# Patient Record
Sex: Female | Born: 1935
Health system: Southern US, Community
[De-identification: ages and names within clinical notes are randomized; demographics above are authoritative.]

## PROBLEM LIST (undated history)

## (undated) DIAGNOSIS — F329 Major depressive disorder, single episode, unspecified: Secondary | ICD-10-CM

## (undated) DIAGNOSIS — Z9889 Other specified postprocedural states: Secondary | ICD-10-CM

## (undated) DIAGNOSIS — M1612 Unilateral primary osteoarthritis, left hip: Secondary | ICD-10-CM

## (undated) DIAGNOSIS — Z8679 Personal history of other diseases of the circulatory system: Secondary | ICD-10-CM

## (undated) DIAGNOSIS — R748 Abnormal levels of other serum enzymes: Secondary | ICD-10-CM

## (undated) DIAGNOSIS — M1990 Unspecified osteoarthritis, unspecified site: Secondary | ICD-10-CM

## (undated) DIAGNOSIS — Z96649 Presence of unspecified artificial hip joint: Secondary | ICD-10-CM

## (undated) DIAGNOSIS — I10 Essential (primary) hypertension: Secondary | ICD-10-CM

## (undated) DIAGNOSIS — E785 Hyperlipidemia, unspecified: Secondary | ICD-10-CM

## (undated) DIAGNOSIS — T4145XA Adverse effect of unspecified anesthetic, initial encounter: Secondary | ICD-10-CM

## (undated) DIAGNOSIS — J189 Pneumonia, unspecified organism: Secondary | ICD-10-CM

## (undated) DIAGNOSIS — H269 Unspecified cataract: Secondary | ICD-10-CM

## (undated) DIAGNOSIS — R112 Nausea with vomiting, unspecified: Secondary | ICD-10-CM

## (undated) DIAGNOSIS — E119 Type 2 diabetes mellitus without complications: Secondary | ICD-10-CM

## (undated) DIAGNOSIS — Z953 Presence of xenogenic heart valve: Secondary | ICD-10-CM

## (undated) DIAGNOSIS — F32A Depression, unspecified: Secondary | ICD-10-CM

## (undated) DIAGNOSIS — M109 Gout, unspecified: Secondary | ICD-10-CM

## (undated) DIAGNOSIS — F419 Anxiety disorder, unspecified: Secondary | ICD-10-CM

## (undated) DIAGNOSIS — T8859XA Other complications of anesthesia, initial encounter: Secondary | ICD-10-CM

## (undated) DIAGNOSIS — I491 Atrial premature depolarization: Principal | ICD-10-CM

## (undated) HISTORY — DX: Unilateral primary osteoarthritis, left hip: M16.12

## (undated) HISTORY — DX: Unspecified cataract: H26.9

## (undated) HISTORY — DX: Personal history of other diseases of the circulatory system: Z86.79

## (undated) HISTORY — PX: COLONOSCOPY: SHX174

## (undated) HISTORY — PX: CARDIAC VALVE REPLACEMENT: SHX585

## (undated) HISTORY — DX: Presence of xenogenic heart valve: Z95.3

## (undated) HISTORY — DX: Hyperlipidemia, unspecified: E78.5

## (undated) HISTORY — DX: Anxiety disorder, unspecified: F41.9

## (undated) HISTORY — PX: JOINT REPLACEMENT: SHX530

## (undated) HISTORY — DX: Essential (primary) hypertension: I10

## (undated) HISTORY — DX: Abnormal levels of other serum enzymes: R74.8

## (undated) HISTORY — DX: Atrial premature depolarization: I49.1

## (undated) HISTORY — DX: Presence of unspecified artificial hip joint: Z96.649

## (undated) HISTORY — DX: Type 2 diabetes mellitus without complications: E11.9

## (undated) HISTORY — PX: ABDOMINAL HYSTERECTOMY: SHX81

---

## 2000-05-14 HISTORY — PX: HIP ARTHROPLASTY: SHX981

## 2008-05-14 DIAGNOSIS — J189 Pneumonia, unspecified organism: Secondary | ICD-10-CM

## 2008-05-14 DIAGNOSIS — Z8679 Personal history of other diseases of the circulatory system: Secondary | ICD-10-CM

## 2008-05-14 HISTORY — DX: Pneumonia, unspecified organism: J18.9

## 2008-05-14 HISTORY — PX: AORTIC VALVE REPLACEMENT: SHX41

## 2008-05-14 HISTORY — PX: LEFT HEART CATH AND CORONARY ANGIOGRAPHY: CATH118249

## 2008-05-14 HISTORY — DX: Personal history of other diseases of the circulatory system: Z86.79

## 2008-08-12 ENCOUNTER — Ambulatory Visit: Payer: Self-pay | Admitting: Thoracic Surgery (Cardiothoracic Vascular Surgery)

## 2008-08-24 DIAGNOSIS — Z953 Presence of xenogenic heart valve: Secondary | ICD-10-CM

## 2008-08-24 HISTORY — DX: Presence of xenogenic heart valve: Z95.3

## 2008-09-07 ENCOUNTER — Ambulatory Visit: Payer: Self-pay | Admitting: Thoracic Surgery (Cardiothoracic Vascular Surgery)

## 2008-10-20 ENCOUNTER — Ambulatory Visit: Payer: Self-pay | Admitting: Thoracic Surgery (Cardiothoracic Vascular Surgery)

## 2009-12-21 ENCOUNTER — Ambulatory Visit (HOSPITAL_COMMUNITY): Admission: RE | Admit: 2009-12-21 | Discharge: 2009-12-21 | Payer: Self-pay | Admitting: Family Medicine

## 2010-05-14 HISTORY — PX: KNEE ARTHROPLASTY: SHX992

## 2010-10-03 ENCOUNTER — Encounter (HOSPITAL_COMMUNITY)
Admission: RE | Admit: 2010-10-03 | Discharge: 2010-10-03 | Disposition: A | Discharge status: Home / Self Care | Payer: Medicare Other | Source: Ambulatory Visit | Attending: Orthopaedic Surgery | Admitting: Orthopaedic Surgery

## 2010-10-03 ENCOUNTER — Other Ambulatory Visit (HOSPITAL_COMMUNITY): Payer: Self-pay | Admitting: Orthopaedic Surgery

## 2010-10-03 ENCOUNTER — Ambulatory Visit (HOSPITAL_COMMUNITY)
Admission: RE | Admit: 2010-10-03 | Discharge: 2010-10-03 | Disposition: A | Discharge status: Home / Self Care | Payer: Medicare Other | Source: Ambulatory Visit | Attending: Orthopaedic Surgery | Admitting: Orthopaedic Surgery

## 2010-10-03 DIAGNOSIS — IMO0002 Reserved for concepts with insufficient information to code with codable children: Secondary | ICD-10-CM | POA: Insufficient documentation

## 2010-10-03 DIAGNOSIS — M1712 Unilateral primary osteoarthritis, left knee: Secondary | ICD-10-CM

## 2010-10-03 DIAGNOSIS — Z01812 Encounter for preprocedural laboratory examination: Secondary | ICD-10-CM | POA: Insufficient documentation

## 2010-10-03 DIAGNOSIS — Z01811 Encounter for preprocedural respiratory examination: Secondary | ICD-10-CM | POA: Insufficient documentation

## 2010-10-03 DIAGNOSIS — R0602 Shortness of breath: Secondary | ICD-10-CM | POA: Insufficient documentation

## 2010-10-03 DIAGNOSIS — M1710 Unilateral primary osteoarthritis, unspecified knee: Secondary | ICD-10-CM | POA: Insufficient documentation

## 2010-10-03 LAB — CBC
HCT: 34.4 % — ABNORMAL LOW (ref 36.0–46.0)
MCHC: 31.7 g/dL (ref 30.0–36.0)
MCV: 78.2 fL (ref 78.0–100.0)
RBC: 4.4 MIL/uL (ref 3.87–5.11)

## 2010-10-03 LAB — PROTIME-INR: Prothrombin Time: 13 seconds (ref 11.6–15.2)

## 2010-10-03 LAB — ABO/RH: ABO/RH(D): B POS

## 2010-10-03 LAB — BASIC METABOLIC PANEL
CO2: 23 mEq/L (ref 19–32)
GFR calc Af Amer: 54 mL/min — ABNORMAL LOW (ref >60)
GFR calc non Af Amer: 45 mL/min — ABNORMAL LOW (ref >60)

## 2010-10-03 LAB — TYPE AND SCREEN

## 2010-10-10 ENCOUNTER — Inpatient Hospital Stay (HOSPITAL_COMMUNITY)
Admission: RE | Admit: 2010-10-10 | Discharge: 2010-10-13 | DRG: 470 | Disposition: A | Discharge status: Skilled Nursing Facility | Payer: Medicare Other | Source: Ambulatory Visit | Attending: Orthopaedic Surgery | Admitting: Orthopaedic Surgery

## 2010-10-10 DIAGNOSIS — Z954 Presence of other heart-valve replacement: Secondary | ICD-10-CM

## 2010-10-10 DIAGNOSIS — Z96649 Presence of unspecified artificial hip joint: Secondary | ICD-10-CM

## 2010-10-10 DIAGNOSIS — I10 Essential (primary) hypertension: Secondary | ICD-10-CM | POA: Diagnosis present

## 2010-10-10 DIAGNOSIS — M1710 Unilateral primary osteoarthritis, unspecified knee: Principal | ICD-10-CM | POA: Diagnosis present

## 2010-10-10 DIAGNOSIS — E119 Type 2 diabetes mellitus without complications: Secondary | ICD-10-CM | POA: Diagnosis present

## 2010-10-10 DIAGNOSIS — Z880 Allergy status to penicillin: Secondary | ICD-10-CM

## 2010-10-10 LAB — GLUCOSE, CAPILLARY

## 2010-10-11 LAB — GLUCOSE, CAPILLARY: Glucose-Capillary: 195 mg/dL — ABNORMAL HIGH (ref 70–99)

## 2010-10-11 LAB — CBC
HCT: 26.9 % — ABNORMAL LOW (ref 36.0–46.0)
Hemoglobin: 9 g/dL — ABNORMAL LOW (ref 12.0–15.0)
MCV: 76 fL — ABNORMAL LOW (ref 78.0–100.0)
Platelets: 230 10*3/uL (ref 150–400)
RDW: 13.4 % (ref 11.5–15.5)

## 2010-10-11 LAB — BASIC METABOLIC PANEL
Calcium: 8.8 mg/dL (ref 8.4–10.5)
Chloride: 98 mEq/L (ref 96–112)
GFR calc Af Amer: >60 mL/min (ref >60)
Sodium: 131 mEq/L — ABNORMAL LOW (ref 135–145)

## 2010-10-11 LAB — PROTIME-INR: Prothrombin Time: 14 seconds (ref 11.6–15.2)

## 2010-10-12 LAB — BASIC METABOLIC PANEL
BUN: 11 mg/dL (ref 6–23)
Calcium: 9.2 mg/dL (ref 8.4–10.5)
Chloride: 99 mEq/L (ref 96–112)
GFR calc non Af Amer: >60 mL/min (ref >60)
Glucose, Bld: 153 mg/dL — ABNORMAL HIGH (ref 70–99)

## 2010-10-12 LAB — CBC
HCT: 25.4 % — ABNORMAL LOW (ref 36.0–46.0)
Hemoglobin: 8.4 g/dL — ABNORMAL LOW (ref 12.0–15.0)
MCV: 74.9 fL — ABNORMAL LOW (ref 78.0–100.0)

## 2010-10-12 LAB — PROTIME-INR: INR: 1.54 — ABNORMAL HIGH (ref 0.00–1.49)

## 2010-10-12 LAB — GLUCOSE, CAPILLARY
Glucose-Capillary: 154 mg/dL — ABNORMAL HIGH (ref 70–99)
Glucose-Capillary: 113 mg/dL — ABNORMAL HIGH (ref 70–99)

## 2010-10-13 LAB — GLUCOSE, CAPILLARY: Glucose-Capillary: 134 mg/dL — ABNORMAL HIGH (ref 70–99)

## 2010-10-13 LAB — BASIC METABOLIC PANEL
CO2: 27 mEq/L (ref 19–32)
GFR calc Af Amer: >60 mL/min (ref >60)
Glucose, Bld: 146 mg/dL — ABNORMAL HIGH (ref 70–99)
Potassium: 4.2 mEq/L (ref 3.5–5.1)
Sodium: 131 mEq/L — ABNORMAL LOW (ref 135–145)

## 2010-10-13 LAB — CBC
HCT: 23.3 % — ABNORMAL LOW (ref 36.0–46.0)
Hemoglobin: 7.9 g/dL — ABNORMAL LOW (ref 12.0–15.0)
RBC: 3.1 MIL/uL — ABNORMAL LOW (ref 3.87–5.11)
WBC: 10.7 10*3/uL — ABNORMAL HIGH (ref 4.0–10.5)

## 2010-10-13 LAB — PROTIME-INR: INR: 1.68 — ABNORMAL HIGH (ref 0.00–1.49)

## 2010-10-16 NOTE — Op Note (Signed)
NAMEKARL, KNARR               ACCOUNT NO.:  [id number]  MEDICAL RECORD NO.:  [id number]           PATIENT TYPE:  I  LOCATION:  5037                         FACILITY:  [facility]  PHYSICIAN:  Lubertha Basque. Kalan Yeley, M.D.DATE OF BIRTH:  1935-08-24  DATE OF PROCEDURE:  10/10/2010 DATE OF DISCHARGE:                              OPERATIVE REPORT   PREOPERATIVE DIAGNOSIS:  Left knee degenerative joint disease.  POSTOPERATIVE DIAGNOSIS:  Left knee degenerative joint disease.  PROCEDURE:  Left total knee replacement.  ANESTHESIA:  General and block.  ATTENDING SURGEON:  Lubertha Basque. Jerl Santos, MD  ASSISTANT:  [name], PA   INDICATIONS FOR PROCEDURE:  The patient is a 75 year old woman with long history of left knee pain.  This has persisted despite oral anti- inflammatories and injection and bracing.  By x-ray, she has advanced degenerative change in the medial compartment.  She has pain which limits her ability to walk and rest, and she is offered a knee replacement.  Informed operative consent was obtained after discussion of possible complications including reaction to anesthesia, infection, DVT, PE, and death.  SUMMARY OF FINDINGS AND PROCEDURE:  Under general anesthesia and a knee block, a left knee replacement was performed.  She had advanced degenerative changes in the medial compartment, but excellent bone quality.  We addressed her problem with a cemented DePuy LCS system using standard plus femur, 3 tibia, 10 deep dish spacer, and 38 all polyethylene patella.  I did include tobramycin antibiotic in the cement.  Bryna Colander assisted throughout and was invaluable to the completion of the case in that he helped position and retract while I performed the procedure.  He also closed simultaneously to help minimize OR time.  DESCRIPTION OF PROCEDURE:  The patient was taken to the operating suite where general anesthetic was applied without difficulty.  She was also given a  block in the preanesthesia area.  She was positioned supine and prepped and draped in normal sterile fashion.  After insertion of IV vancomycin, the left leg was elevated, exsanguinated, and tourniquet inflated about the thigh.  A longitudinal anterior incision was made with dissection down to the extensor mechanism.  All appropriate anti- infective measures were used including preoperative IV antibiotic, Betadine impregnated drape, and closed-hooded exhaust systems for each member of the surgical team.  A medial parapatellar incision was made. The kneecap was flipped and the knee flexed.  Some residual meniscal tissues along with the ACL and PCL.  Most of the fat pad was excised. An extramedullary guide was placed in the tibia to make a cut roughly flat.  An intramedullary guide was then placed in the femur to make anterior and posterior cuts creating a flexion gap of 10 mm.  I did re- cut the tibia one time to give [place] good flexion gap at 10 mm.  We then placed a second intramedullary guide in the femur to make a distal cut creating an equal extension gap of 10 mm balancing the knee.  The femur sized to a standard plus and the tibia to a 3 with the appropriate guides were placed and utilized.  A  trial reduction was done and the leg easily came to slight hyperextension and flexed well.  The patella tracked well.  The patella was cut down by about 10 mm to 15 cm in width.  That sized to 38 with appropriate guide placed and utilized. The trial component was removed followed by pulsatile lavage, irrigation of all three cut bony surfaces.  Cement was mixed including tobramycin and was pressurized onto the bones.  The aforementioned components were placed followed by pressure was held on the components until the cement had hardened.  Excess cement was trimmed.  The tourniquet was deflated and a small amount of bleeding was easily controlled with Bovie cautery and some pressure.  The knee was  again irrigated followed by placement of a drain exiting superolaterally.  The extensor mechanism was reapproximated with #1 Vicryl in an interrupted fashion followed by subcutaneous reapproximation with 0 and 2-0 undyed Vicryl and skin closure with staples.  Adaptic was applied followed by dry gauze and loose Ace wrap.  Estimated blood loss, intraoperative fluids, as well as accurate tourniquet time can be obtained from anesthesia records.  DISPOSITION:  The patient was extubated in the operating room and taken to recovery room in stable condition.  She was to be admitted to the Orthopedic Surgery Service for appropriate postop care to include perioperative antibiotics and Coumadin plus Lovenox for DVT prophylaxis.     Lubertha Basque Jerl Santos, M.D.     PGD/MEDQ  D:  10/10/2010  T:  10/10/2010  Job:  [id number]  Electronically Signed by Marcene Corning M.D. on 10/16/2010 01:40:12 PM

## 2010-10-27 ENCOUNTER — Ambulatory Visit (HOSPITAL_COMMUNITY)
Admission: RE | Admit: 2010-10-27 | Discharge: 2010-10-27 | Disposition: A | Discharge status: Home / Self Care | Payer: Medicare Other | Source: Ambulatory Visit | Attending: Internal Medicine | Admitting: Internal Medicine

## 2010-10-27 DIAGNOSIS — Z96659 Presence of unspecified artificial knee joint: Secondary | ICD-10-CM | POA: Insufficient documentation

## 2010-10-27 DIAGNOSIS — M7989 Other specified soft tissue disorders: Secondary | ICD-10-CM

## 2010-11-14 NOTE — H&P (Signed)
Linda Lawson, Linda Lawson               ACCOUNT NO.:  [id number]  MEDICAL RECORD NO.:  [id number]           PATIENT TYPE:  LOCATION:                                 FACILITY:  PHYSICIAN:  Lubertha Basque. Akili Cuda, M.D.DATE OF BIRTH:  16-Feb-1936  DATE OF ADMISSION:10/10/2010 DATE OF DISCHARGE:                             HISTORY & PHYSICAL   Her surgery is tomorrow, Oct 10, 2010.  SOCIAL SECURITY NUMBER:  [id number].  CHIEF COMPLAINT:  Left knee pain.  HISTORY OF PRESENT ILLNESS:  Linda Lawson is a 75 year old female, who is a patient of Dr. Paulita Fujita, referred to our practice for further evaluation of left knee pain.  She on x-ray has end-stage DJD of her knee, was complaining of pain when she ambulates, trouble sleeping at nighttime.  She has also tried Tylenol for pain relief and has had physical therapy which has really been of minimal to no help.  We have discussed with her treatment options that being total knee replacement on the left side.  PERTINENT PAST MEDICAL HISTORY:  She is a diabetic, under oral agent control.  She has allergies to PENICILLIN which causes the itching and swelling.  Her medical coverage is through Dr. Dorothyann Peng.  MEDICATION LIST:  Aspirin 81 mg, hydrochlorothiazide, Zocor, and metformin.  ADDITIONAL 14-POINT REVIEW OF SYSTEMS:  She does wear glasses.  Denies any cardiac issues, pulmonary issues currently but in the past, she had been treated for an aortic valve replacement done in 2010 in [facility]. This is a pig valve which does not require anticoagulation.  She currently does not smoke, does not drink alcohol.  PREVIOUS SURGICAL EXPERIENCE:  Besides her heart valve, a right-sided hip replacement.  PHYSICAL EXAMINATION:  VITAL SIGNS:  Stable.  Blood pressure within normal limits.  Temperature is afebrile. HEENT:  Eyes PERRLA.  Visual fields were normal. NECK:  Cervical motion is full.  No adenopathy noted. LUNGS:  Clear to A and  P. CARDIAC:  Regular rate and rhythm with a mechanical heart valve sound. ABDOMEN:  Positive bowel sounds.  Negative guarding.  No spleen or liver enlargement.  No masses or tenderness noted. EXTREMITIES:  Upper extremities motion is full and pain free.  Lower extremities neurovascular status is intact without any pretibial edema or pitting edema noted.  Good sensation, capillary refill to her toes. Examination of her left knee, she has motion of 0-120.  There is significant crepitation to range of motion.  No significant effusion, but there is severe pain along the medial joint line and patellofemoral area.  ASSESSMENT: 1. Left knee end-stage degenerative joint disease. 2. Status post right hip replacement in 2002 in [place]. 3. Porcine aortic valve replaced in 2010 in [facility] with no current     anticoagulation.  Plan is to do a total knee replacement on this patient and then admit to the hospital postoperatively for pain management control.     [name], P.A.   ______________________________ Lubertha Basque. Jerl Santos, M.D.    MC/MEDQ  D:  10/09/2010  T:  10/09/2010  Job:  [id number]  Electronically Signed by [name] P.A. on  10/24/2010 04:03:13 PM Electronically Signed by Marcene Corning M.D. on 11/14/2010 05:05:54 PM

## 2010-11-14 NOTE — Discharge Summary (Signed)
Linda Lawson, Linda Lawson               ACCOUNT NO.:  [id number]  MEDICAL RECORD NO.:  [id number]           PATIENT TYPE:  LOCATION:                                 FACILITY:  PHYSICIAN:  Lubertha Basque. Analiese Krupka, M.D.DATE OF BIRTH:  08-Aug-1935  DATE OF ADMISSION:  10/10/2010 DATE OF DISCHARGE:  10/13/2010                              DISCHARGE SUMMARY   ADMITTING DIAGNOSES: 1. Left knee end-stage degenerative joint disease. 2. Status post right hip replacement 2002. 3. Porcine aortic valve replaced in 2010 at [facility].  DISCHARGE DIAGNOSES: 1. Left knee end-stage degenerative joint disease. 2. Status post right hip replacement 2002. 3. Porcine aortic valve replaced in 2010 at [facility].  OPERATIONS:  Left total knee replacement.  BRIEF HISTORY:  Ms. Buske is a 75 year old female patient of Dr. Eula Listen in our practice who had referred to [place] because of increasing left knee pain.  She is having pain with every step, trouble sleeping at nighttime, unrelieved by over-the-counter anti-inflammatory medicines or Tylenol.  X-rays reveal end-stage bone-on-bone DJD.  PERTINENT LABORATORY AND X-RAY FINDINGS:  Hemoglobin 8.4, hematocrit 25.4, WBC is 10.8, platelets 202,000.  Sodium 132, potassium 4.2, BUN 11, creatinine 0.91, glucose 153, INR 1.54 last test prior to discharge at the time of dictation.  COURSE IN THE HOSPITAL:  She was admitted postoperatively and placed on a variety of p.o. and IV analgesics for pain, pharmacy for DVT prophylaxis for Coumadin and Lovenox, knee-high TEDs and then we implemented the p.r.n. orthopedic orders and joint replacement protocol orders as well.  She was given appropriate perioperative antibiotics, vancomycin, stool softeners.  Foley catheter was used the first 24 hours, discontinued and able to void on her own.  Physical therapy for weightbearing as tolerated and CPM machine advancing as tolerated, 0-50 degrees and appropriate antiemetics,  antispasmodics and then kept her on home medicines as well.  The first day postop her vital signs were stable.  Hemoglobin 9.1, positive bowel sounds, negative guarding in the abdomen, positive breath sounds, left knee within normal limits.  Drain was in, Hemovac type without sign of infection and she was progressing well with physical therapy.  The second day postop, the drain was pulled, the dressing was changed.  I was working with physical therapy and was able to get up and about in the room and other vital signs were stable, lungs clear, and abdomen was soft and not had a bowel movement second day postop.  Third day postop, she continued to progress well and was cleared by physical therapy to be discharged to a skilled nursing facility which was [facility] which was prearranged prior to admission for extended care postop as she did not have sufficient family at home.  CONDITION ON DISCHARGE:  Improved.  FOLLOWUP:  She could be weightbearing as tolerated with crutches.  May change the dressing daily on her wound.  DIET:  No restrictions.  She continued to have her blood sugars monitored per the physician at the [facility] protocol.  Return to Dr. Jerl Santos office in 2 weeks for any sign of infection which would be increasing redness, pain, drainage,  to call our office [phone] for an appointment, otherwise we will see her in 2 weeks.  She will be on Coumadin for 2 weeks postoperatively with her INRs tested to be in the range of 2-3.0.     [name], P.A.   ______________________________ Lubertha Basque. Jerl Santos, M.D.    MC/MEDQ  D:  10/12/2010  T:  10/13/2010  Job:  [id number]  Electronically Signed by [name] P.A. on 10/24/2010 04:03:37 PM Electronically Signed by Marcene Corning M.D. on 11/14/2010 05:05:57 PM

## 2011-01-23 ENCOUNTER — Other Ambulatory Visit (HOSPITAL_COMMUNITY): Payer: Self-pay | Admitting: Internal Medicine

## 2011-01-23 DIAGNOSIS — Z1231 Encounter for screening mammogram for malignant neoplasm of breast: Secondary | ICD-10-CM

## 2011-02-01 ENCOUNTER — Ambulatory Visit (HOSPITAL_COMMUNITY)
Admission: RE | Admit: 2011-02-01 | Discharge: 2011-02-01 | Disposition: A | Discharge status: Home / Self Care | Payer: Medicare Other | Source: Ambulatory Visit | Attending: Internal Medicine | Admitting: Internal Medicine

## 2011-02-01 DIAGNOSIS — Z1231 Encounter for screening mammogram for malignant neoplasm of breast: Secondary | ICD-10-CM | POA: Insufficient documentation

## 2012-02-12 ENCOUNTER — Other Ambulatory Visit (HOSPITAL_COMMUNITY): Payer: Self-pay | Admitting: Internal Medicine

## 2012-02-12 DIAGNOSIS — Z1231 Encounter for screening mammogram for malignant neoplasm of breast: Secondary | ICD-10-CM

## 2012-02-29 ENCOUNTER — Ambulatory Visit (HOSPITAL_COMMUNITY)
Admission: RE | Admit: 2012-02-29 | Discharge: 2012-02-29 | Disposition: A | Discharge status: Home / Self Care | Payer: Medicare Other | Source: Ambulatory Visit | Attending: Internal Medicine | Admitting: Internal Medicine

## 2012-02-29 DIAGNOSIS — Z1231 Encounter for screening mammogram for malignant neoplasm of breast: Secondary | ICD-10-CM | POA: Insufficient documentation

## 2012-03-05 ENCOUNTER — Other Ambulatory Visit: Payer: Self-pay | Admitting: Internal Medicine

## 2012-03-05 DIAGNOSIS — R928 Other abnormal and inconclusive findings on diagnostic imaging of breast: Secondary | ICD-10-CM

## 2012-03-12 ENCOUNTER — Ambulatory Visit
Admission: RE | Admit: 2012-03-12 | Discharge: 2012-03-12 | Disposition: A | Discharge status: Home / Self Care | Payer: Medicare Other | Source: Ambulatory Visit | Attending: Internal Medicine | Admitting: Internal Medicine

## 2012-03-12 DIAGNOSIS — R928 Other abnormal and inconclusive findings on diagnostic imaging of breast: Secondary | ICD-10-CM

## 2012-06-12 ENCOUNTER — Encounter: Payer: Medicare Other | Attending: Internal Medicine | Admitting: *Deleted

## 2012-06-12 ENCOUNTER — Encounter: Payer: Self-pay | Admitting: *Deleted

## 2012-06-12 DIAGNOSIS — Z713 Dietary counseling and surveillance: Secondary | ICD-10-CM | POA: Insufficient documentation

## 2012-06-12 DIAGNOSIS — E119 Type 2 diabetes mellitus without complications: Secondary | ICD-10-CM | POA: Insufficient documentation

## 2012-06-12 NOTE — Progress Notes (Signed)
  Patient was seen on 06/12/2012 for the first of a series of three diabetes self-management courses at [facility]. Patients last A1C was 7.9% on 05/16/2012. The following learning objectives were met by the patient during this course:   Defines the role of glucose and insulin  Identifies type of diabetes and pathophysiology  Defines the diagnostic criteria for diabetes and prediabetes  States the risk factors for Type 2 Diabetes  States the symptoms of Type 2 Diabetes  Defines Type 2 Diabetes treatment goals  Defines Type 2 Diabetes treatment options  States the rationale for glucose monitoring  Identifies A1C, glucose targets, and testing times  Identifies proper sharps disposal  Defines the purpose of a diabetes food plan  Identifies carbohydrate food groups  Defines effects of carbohydrate foods on glucose levels  Identifies carbohydrate choices/grams/food labels  States benefits of physical activity and effect on glucose  Review of suggested activity guidelines  Handouts given during class include:  Type 2 Diabetes: Basics Book  My Food Plan Book  Food and Activity Log  Follow-Up Plan: Core Class 2

## 2012-06-19 ENCOUNTER — Encounter: Payer: Medicare Other | Attending: Internal Medicine | Admitting: Dietician

## 2012-06-19 DIAGNOSIS — E119 Type 2 diabetes mellitus without complications: Secondary | ICD-10-CM | POA: Insufficient documentation

## 2012-06-19 DIAGNOSIS — E1129 Type 2 diabetes mellitus with other diabetic kidney complication: Secondary | ICD-10-CM

## 2012-06-19 DIAGNOSIS — Z713 Dietary counseling and surveillance: Secondary | ICD-10-CM | POA: Insufficient documentation

## 2012-06-21 NOTE — Progress Notes (Signed)
  Patient was seen on 06/19/2012 for the second of a series of three diabetes self-management courses at the [facility]. The following learning objectives were met by the patient during this course:   Explain basic nutrition maintenance and quality assurance  Describe causes, symptoms and treatment of hypoglycemia and hyperglycemia  Explain how to manage diabetes during illness  Describe the importance of good nutrition for health and healthy eating strategies  List strategies to follow meal plan when dining out  Describe the effects of alcohol on glucose and how to use it safely  Describe problem solving skills for day-to-day glucose challenges  Describe strategies to use when treatment plan needs to change  Identify important factors involved in successful weight loss  Describe ways to remain physically active  Describe the impact of regular activity on insulin resistance   Handouts given in class:  Refrigerator magnet for Sick Day Guidelines  [facility] Oral medication/insulin handout  Nutritional Strategies for Weight Loss with Diabetes  Solving Glucose Puzzles  Follow-Up Plan: Patient will attend the final class of the ADA Diabetes Self-Care Education.

## 2012-07-03 ENCOUNTER — Encounter: Payer: Medicare Other | Admitting: *Deleted

## 2012-07-03 DIAGNOSIS — E119 Type 2 diabetes mellitus without complications: Secondary | ICD-10-CM

## 2012-07-03 NOTE — Patient Instructions (Signed)
Goals:  Follow Diabetes Meal Plan as instructed  Eat 3 meals and 2 snacks, every 3-5 hrs  Limit carbohydrate intake to 45 grams carbohydrate/meal  Limit carbohydrate intake to 15 grams carbohydrate/snack  Add lean protein foods to meals/snacks  Monitor glucose levels as instructed by your doctor  Aim for 30 mins of physical activity daily  Bring food record and glucose log to your next nutrition visit  

## 2012-07-03 NOTE — Progress Notes (Signed)
  Patient was seen on 07/03/12 for the third of a series of three diabetes self-management courses at the [facility]. The following learning objectives were met by the patient during this course:    Describe how diabetes changes over time   Identify diabetes complications and ways to prevent them   Describe strategies that can promote heart health including lowering blood pressure and cholesterol   Describe strategies to lower dietary fat and sodium in the diet   Identify physical activities that benefit cardiovascular health   Evaluate success in meeting personal goal   Describe the belief that they can live successfully with diabetes day to day   Establish 2-3 goals that they will plan to diligently work on until they return for the free 86-month follow-up visit  The following handouts were given in class:  3 Month Follow Up Visit handout  Goal setting handout  Class evaluation form  Your patient has established the following 3 month goals for diabetes self-care:  Count carbohydrates at most meal and snacks  Take diabetes medications as scheduled  To help manage stress, I will walk 2 days/week  Follow-Up Plan: Patient will attend a 3 month follow-up visit for diabetes self-management education.

## 2012-08-28 ENCOUNTER — Other Ambulatory Visit: Payer: Self-pay | Admitting: Internal Medicine

## 2012-08-28 DIAGNOSIS — N63 Unspecified lump in unspecified breast: Secondary | ICD-10-CM

## 2012-09-11 ENCOUNTER — Ambulatory Visit
Admission: RE | Admit: 2012-09-11 | Discharge: 2012-09-11 | Disposition: A | Discharge status: Home / Self Care | Payer: Medicare Other | Source: Ambulatory Visit | Attending: Internal Medicine | Admitting: Internal Medicine

## 2012-09-11 DIAGNOSIS — N63 Unspecified lump in unspecified breast: Secondary | ICD-10-CM

## 2012-10-27 ENCOUNTER — Encounter: Payer: Medicare Other | Attending: Internal Medicine | Admitting: *Deleted

## 2012-10-27 ENCOUNTER — Encounter: Payer: Self-pay | Admitting: *Deleted

## 2012-10-27 DIAGNOSIS — Z713 Dietary counseling and surveillance: Secondary | ICD-10-CM | POA: Insufficient documentation

## 2012-10-27 DIAGNOSIS — E119 Type 2 diabetes mellitus without complications: Secondary | ICD-10-CM | POA: Insufficient documentation

## 2012-10-27 NOTE — Progress Notes (Signed)
  Patient was seen on 10/27/2012 for their 3 month follow-up as a part of the diabetes self-management courses at the [facility]. The following learning objectives were met by your patient during this course:  Patient self reports the following:  Diabetes control has improved since diabetes self-management training: YES Number of days blood glucose is >200: none Last MD appointment for diabetes: April, 2014 Changes in treatment plan: not taking Metformin lately because unsure if should, plans to resume now. Also has started going to gym 3 days a week with great results! Confidence with ability to manage diabetes: YES, she understands the diabetes much better now. Areas for improvement with diabetes self-care: take medications as prescribed Willingness to participate in diabetes support group: perhaps  Please see Diabetes Flow sheet for findings related to patient's self-care.  Follow-Up Plan: Patient is eligible for a "free" 30 minute diabetes self-care appointment in the next year. Patient to call and schedule as needed.

## 2013-01-27 ENCOUNTER — Ambulatory Visit: Payer: Medicare Other | Admitting: *Deleted

## 2013-02-05 ENCOUNTER — Encounter: Payer: Medicare Other | Attending: Internal Medicine | Admitting: *Deleted

## 2013-02-05 ENCOUNTER — Encounter: Payer: Self-pay | Admitting: *Deleted

## 2013-02-05 VITALS — Ht 63.0 in | Wt 193.2 lb

## 2013-02-05 DIAGNOSIS — E119 Type 2 diabetes mellitus without complications: Secondary | ICD-10-CM | POA: Insufficient documentation

## 2013-02-05 DIAGNOSIS — Z713 Dietary counseling and surveillance: Secondary | ICD-10-CM | POA: Insufficient documentation

## 2013-02-05 NOTE — Progress Notes (Signed)
  Patient was seen on 02/05/2013 for their 6 month follow-up as a part of the diabetes self-management courses at the [facility]. The following learning objectives were met by your patient during this course:  Patient self reports the following:  States she has changed her eating habits by stopping any snack after supper, has added walking outside in addition to going to [facility] exercise class and Line Dancing. Reports that her BG's are better too. Down from 190's to 140's on average. She states she is getting her next A1c done in October.  Weight today 193.2 lbs, down 6 pounds since started Class in January.  Diabetes control has improved since diabetes self-management training: YES Number of days blood glucose is >200: none and they are improved from 190's to 140's now Last MD appointment for diabetes: April, 2014 Changes in treatment plan: has resumed Metformin again Also has continued going to gym 3 days a week and added extra walking outside with great results! Confidence with ability to manage diabetes: YES, she understands the diabetes much better now. Areas for improvement with diabetes self-care: doing well now Willingness to participate in diabetes support group: perhaps,   Please see Diabetes Flow sheet for findings related to patient's self-care.  Follow-Up Plan: May call for follow up as needed. She has completed the Core Class Curriculum.

## 2013-03-09 ENCOUNTER — Ambulatory Visit: Payer: Self-pay | Admitting: Podiatry

## 2013-04-08 ENCOUNTER — Encounter: Payer: Self-pay | Admitting: Podiatry

## 2013-04-08 ENCOUNTER — Encounter: Payer: Self-pay | Admitting: *Deleted

## 2013-04-14 ENCOUNTER — Encounter: Payer: Self-pay | Admitting: Podiatry

## 2013-04-14 ENCOUNTER — Ambulatory Visit (INDEPENDENT_AMBULATORY_CARE_PROVIDER_SITE_OTHER): Payer: Medicare Other | Admitting: Podiatry

## 2013-04-14 VITALS — BP 106/54 | HR 68 | Resp 16 | Ht 64.0 in | Wt 185.0 lb

## 2013-04-14 DIAGNOSIS — M79609 Pain in unspecified limb: Secondary | ICD-10-CM

## 2013-04-14 DIAGNOSIS — E1149 Type 2 diabetes mellitus with other diabetic neurological complication: Secondary | ICD-10-CM

## 2013-04-14 DIAGNOSIS — B351 Tinea unguium: Secondary | ICD-10-CM

## 2013-04-14 MED ORDER — PREGABALIN 50 MG PO CAPS: ORAL_CAPSULE | ORAL | Status: DC

## 2013-04-14 NOTE — Progress Notes (Signed)
Linda Lawson presents today with a chief complaint of painful E. elongated toenails and numbness and tingling in her feet it bothers her primarily at nighttime.  Objective: I have reviewed her past medical history medications and allergies. Vital signs are stable she is alert and oriented x3. Pulses are strongly palpable bilateral. Nails are thick yellow dystrophic clinically mycotic. Decrease in sensorium per Semmes-Weinstein monofilament bilateral. Muscle strength is normal.  Assessment: Diabetic peripheral neuropathy with pain in limb secondary to onychomycosis.  Plan: Wrote her prescription for Lyrica she was started on in January. I also debrided nails 1 through 5 bilateral is cover service secondary to pain. I will followup with her one month after she started taking Lyrica. Went ahead and scheduled her in 3 months out for her nail debridement.

## 2013-07-07 ENCOUNTER — Ambulatory Visit: Payer: Medicare Other | Admitting: Podiatry

## 2013-08-06 ENCOUNTER — Ambulatory Visit: Payer: Medicare Other | Admitting: Podiatry

## 2013-08-20 ENCOUNTER — Ambulatory Visit: Payer: Medicare Other | Admitting: Podiatry

## 2013-08-21 ENCOUNTER — Ambulatory Visit (INDEPENDENT_AMBULATORY_CARE_PROVIDER_SITE_OTHER): Payer: Commercial Managed Care - HMO | Admitting: Podiatry

## 2013-08-21 VITALS — BP 152/69 | HR 56 | Resp 12

## 2013-08-21 DIAGNOSIS — E1149 Type 2 diabetes mellitus with other diabetic neurological complication: Secondary | ICD-10-CM

## 2013-08-21 DIAGNOSIS — B351 Tinea unguium: Secondary | ICD-10-CM

## 2013-08-21 DIAGNOSIS — M79609 Pain in unspecified limb: Secondary | ICD-10-CM

## 2013-08-21 NOTE — Progress Notes (Signed)
She presents today with a chief complaint of painful elongated toenails one through 5 bilateral.  Objective: Nails are thick yellow dystrophic onychomycotic and painful palpation. Pulses are palpable bilateral.  Assessment: Pain in limb secondary to onychomycosis 1 through 5 bilateral.  Plan: Debridement of nails in thickness and length as cover service secondary to pain.

## 2013-09-15 ENCOUNTER — Other Ambulatory Visit: Payer: Self-pay | Admitting: Podiatry

## 2013-09-17 ENCOUNTER — Other Ambulatory Visit: Payer: Self-pay | Admitting: Podiatry

## 2013-09-18 ENCOUNTER — Telehealth: Payer: Self-pay | Admitting: *Deleted

## 2013-09-18 NOTE — Telephone Encounter (Signed)
I called and left the patient a message that her prescription for Lyrica is ready to be picked up.  For some reason we are not able to send it to your pharmacy.  Please come by to pick it up.

## 2013-12-22 ENCOUNTER — Ambulatory Visit (INDEPENDENT_AMBULATORY_CARE_PROVIDER_SITE_OTHER): Payer: Commercial Managed Care - HMO | Admitting: Podiatry

## 2013-12-22 ENCOUNTER — Encounter: Payer: Self-pay | Admitting: Podiatry

## 2013-12-22 DIAGNOSIS — M79609 Pain in unspecified limb: Secondary | ICD-10-CM

## 2013-12-22 DIAGNOSIS — B351 Tinea unguium: Secondary | ICD-10-CM

## 2013-12-22 DIAGNOSIS — E1149 Type 2 diabetes mellitus with other diabetic neurological complication: Secondary | ICD-10-CM

## 2013-12-22 DIAGNOSIS — M79676 Pain in unspecified toe(s): Secondary | ICD-10-CM

## 2013-12-22 MED ORDER — PREGABALIN 50 MG PO CAPS: 50.0000 mg | ORAL_CAPSULE | Freq: Two times a day (BID) | ORAL | Status: DC

## 2013-12-22 NOTE — Progress Notes (Signed)
She presents today with a chief complaint of painful elongated toenails one through 5 bilateral.  Objective: Nails are thick yellow dystrophic onychomycotic and painful palpation. Pulses are palpable bilateral.  Assessment: Pain in limb secondary to onychomycosis 1 through 5 bilateral.  Plan: Debridement of nails

## 2014-03-22 ENCOUNTER — Other Ambulatory Visit: Payer: Self-pay | Admitting: Family Medicine

## 2014-03-22 DIAGNOSIS — N63 Unspecified lump in unspecified breast: Secondary | ICD-10-CM

## 2014-03-23 ENCOUNTER — Ambulatory Visit (INDEPENDENT_AMBULATORY_CARE_PROVIDER_SITE_OTHER): Payer: Commercial Managed Care - HMO | Admitting: Podiatry

## 2014-03-23 ENCOUNTER — Encounter: Payer: Self-pay | Admitting: Podiatry

## 2014-03-23 VITALS — BP 112/78 | HR 77 | Resp 17

## 2014-03-23 DIAGNOSIS — M79676 Pain in unspecified toe(s): Secondary | ICD-10-CM

## 2014-03-23 DIAGNOSIS — B351 Tinea unguium: Secondary | ICD-10-CM

## 2014-03-23 NOTE — Progress Notes (Signed)
   Subjective:    Patient ID: Linda DutyEvelyn Guadron, female    DOB: [birth year], 78 y.o.   MRN: [id number]  HPI Pt presents for nail debridement, needs left heel callus trim   Review of Systems     Objective:   Physical Exam: Pulses are palpable bilateral. Nails are thick yellow dystrophic onychomycotic painful palpation.        Assessment & Plan:  Assessment: Pain in limb secondary to onychomycosis 1 through 5 bilateral.  Plan: Debridement of nails 1 through 5 bilateral.

## 2014-04-13 ENCOUNTER — Ambulatory Visit
Admission: RE | Admit: 2014-04-13 | Discharge: 2014-04-13 | Disposition: A | Discharge status: Home / Self Care | Payer: Commercial Managed Care - HMO | Source: Ambulatory Visit | Attending: Family Medicine | Admitting: Family Medicine

## 2014-04-13 DIAGNOSIS — N63 Unspecified lump in unspecified breast: Secondary | ICD-10-CM

## 2014-05-27 DIAGNOSIS — I4891 Unspecified atrial fibrillation: Secondary | ICD-10-CM | POA: Diagnosis not present

## 2014-05-27 DIAGNOSIS — Z952 Presence of prosthetic heart valve: Secondary | ICD-10-CM | POA: Diagnosis not present

## 2014-06-22 ENCOUNTER — Ambulatory Visit: Payer: Commercial Managed Care - HMO | Admitting: Podiatry

## 2014-06-30 DIAGNOSIS — R6889 Other general symptoms and signs: Secondary | ICD-10-CM | POA: Diagnosis not present

## 2014-06-30 DIAGNOSIS — N189 Chronic kidney disease, unspecified: Secondary | ICD-10-CM | POA: Diagnosis not present

## 2014-06-30 DIAGNOSIS — I10 Essential (primary) hypertension: Secondary | ICD-10-CM | POA: Diagnosis not present

## 2014-07-01 DIAGNOSIS — R6889 Other general symptoms and signs: Secondary | ICD-10-CM | POA: Diagnosis not present

## 2014-07-01 DIAGNOSIS — Z23 Encounter for immunization: Secondary | ICD-10-CM | POA: Diagnosis not present

## 2014-07-01 DIAGNOSIS — E119 Type 2 diabetes mellitus without complications: Secondary | ICD-10-CM | POA: Diagnosis not present

## 2014-07-01 DIAGNOSIS — N189 Chronic kidney disease, unspecified: Secondary | ICD-10-CM | POA: Diagnosis not present

## 2014-07-01 DIAGNOSIS — E782 Mixed hyperlipidemia: Secondary | ICD-10-CM | POA: Diagnosis not present

## 2014-07-01 DIAGNOSIS — I10 Essential (primary) hypertension: Secondary | ICD-10-CM | POA: Diagnosis not present

## 2014-08-05 ENCOUNTER — Ambulatory Visit: Payer: Commercial Managed Care - HMO

## 2014-08-19 ENCOUNTER — Ambulatory Visit (INDEPENDENT_AMBULATORY_CARE_PROVIDER_SITE_OTHER): Payer: Commercial Managed Care - HMO | Admitting: Podiatry

## 2014-08-19 ENCOUNTER — Encounter: Payer: Self-pay | Admitting: Podiatry

## 2014-08-19 DIAGNOSIS — J309 Allergic rhinitis, unspecified: Secondary | ICD-10-CM | POA: Diagnosis not present

## 2014-08-19 DIAGNOSIS — B351 Tinea unguium: Secondary | ICD-10-CM

## 2014-08-19 DIAGNOSIS — E782 Mixed hyperlipidemia: Secondary | ICD-10-CM | POA: Diagnosis not present

## 2014-08-19 DIAGNOSIS — R6889 Other general symptoms and signs: Secondary | ICD-10-CM | POA: Diagnosis not present

## 2014-08-19 DIAGNOSIS — I10 Essential (primary) hypertension: Secondary | ICD-10-CM | POA: Diagnosis not present

## 2014-08-19 DIAGNOSIS — M79676 Pain in unspecified toe(s): Secondary | ICD-10-CM

## 2014-08-19 DIAGNOSIS — E119 Type 2 diabetes mellitus without complications: Secondary | ICD-10-CM | POA: Diagnosis not present

## 2014-08-19 NOTE — Progress Notes (Signed)
Presents today with chief complaint of painful elongated toenails.  Objective: Vital signs are stable alert and oriented 3. Pulses are palpable bilateral. Nails are thick yellow dystrophic onychomycotic and painful palpation.  Assessment: Pain in limb secondary to onychomycosis 1 through 5 bilateral.  Plan: Discussed etiology pathology conservative versus surgical therapies. Debrided nails 1 through 5 bilateral as a covered service. Follow up with her in 3 months

## 2014-11-09 DIAGNOSIS — N183 Chronic kidney disease, stage 3 (moderate): Secondary | ICD-10-CM | POA: Diagnosis not present

## 2014-11-09 DIAGNOSIS — D631 Anemia in chronic kidney disease: Secondary | ICD-10-CM | POA: Diagnosis not present

## 2014-11-09 DIAGNOSIS — I129 Hypertensive chronic kidney disease with stage 1 through stage 4 chronic kidney disease, or unspecified chronic kidney disease: Secondary | ICD-10-CM | POA: Diagnosis not present

## 2014-11-09 DIAGNOSIS — E1129 Type 2 diabetes mellitus with other diabetic kidney complication: Secondary | ICD-10-CM | POA: Diagnosis not present

## 2014-11-10 DIAGNOSIS — M179 Osteoarthritis of knee, unspecified: Secondary | ICD-10-CM | POA: Diagnosis not present

## 2014-11-17 DIAGNOSIS — N189 Chronic kidney disease, unspecified: Secondary | ICD-10-CM | POA: Diagnosis not present

## 2014-11-17 DIAGNOSIS — E119 Type 2 diabetes mellitus without complications: Secondary | ICD-10-CM | POA: Diagnosis not present

## 2014-11-17 DIAGNOSIS — I10 Essential (primary) hypertension: Secondary | ICD-10-CM | POA: Diagnosis not present

## 2014-11-17 DIAGNOSIS — E782 Mixed hyperlipidemia: Secondary | ICD-10-CM | POA: Diagnosis not present

## 2014-12-02 ENCOUNTER — Encounter: Payer: Self-pay | Admitting: Podiatry

## 2014-12-02 ENCOUNTER — Ambulatory Visit (INDEPENDENT_AMBULATORY_CARE_PROVIDER_SITE_OTHER): Payer: Commercial Managed Care - HMO | Admitting: Podiatry

## 2014-12-02 DIAGNOSIS — B351 Tinea unguium: Secondary | ICD-10-CM | POA: Diagnosis not present

## 2014-12-02 DIAGNOSIS — M79676 Pain in unspecified toe(s): Secondary | ICD-10-CM | POA: Diagnosis not present

## 2014-12-02 NOTE — Progress Notes (Signed)
Patient ID: Linda Lawson, female   DOB: March 30, 1936, 79 y.o.   MRN: [id number] Complaint:  Visit Type: Patient returns to my office for continued preventative foot care services. Complaint: Patient states" my nails have grown long and thick and become painful to walk and wear shoes" . The patient presents for preventative foot care services. No changes to ROS  Podiatric Exam: Vascular: dorsalis pedis and posterior tibial pulses are palpable bilateral. Capillary return is immediate. Temperature gradient is WNL. Skin turgor WNL  Sensorium: Normal Semmes Weinstein monofilament test. Normal tactile sensation bilaterally. Nail Exam: Pt has thick disfigured discolored nails with subungual debris noted bilateral entire nail hallux through fifth toenails Ulcer Exam: There is no evidence of ulcer or pre-ulcerative changes or infection. Orthopedic Exam: Muscle tone and strength are WNL. No limitations in general ROM. No crepitus or effusions noted. Foot type and digits show no abnormalities. Bony prominences are unremarkable. Skin: No Porokeratosis. No infection or ulcers  Diagnosis:  Onychomycosis, , Pain in right toe, pain in left toes  Treatment & Plan Procedures and Treatment: Consent by patient was obtained for treatment procedures. The patient understood the discussion of treatment and procedures well. All questions were answered thoroughly reviewed. Debridement of mycotic and hypertrophic toenails, 1 through 5 bilateral and clearing of subungual debris. No ulceration, no infection noted.  Return Visit-Office Procedure: Patient instructed to return to the office for a follow up visit 3 months for continued evaluation and treatment.

## 2014-12-14 DIAGNOSIS — E875 Hyperkalemia: Secondary | ICD-10-CM | POA: Diagnosis not present

## 2014-12-14 DIAGNOSIS — R7989 Other specified abnormal findings of blood chemistry: Secondary | ICD-10-CM | POA: Diagnosis not present

## 2014-12-14 DIAGNOSIS — N19 Unspecified kidney failure: Secondary | ICD-10-CM | POA: Diagnosis not present

## 2014-12-14 DIAGNOSIS — E119 Type 2 diabetes mellitus without complications: Secondary | ICD-10-CM | POA: Diagnosis not present

## 2014-12-14 DIAGNOSIS — M7542 Impingement syndrome of left shoulder: Secondary | ICD-10-CM | POA: Diagnosis not present

## 2014-12-17 DIAGNOSIS — N19 Unspecified kidney failure: Secondary | ICD-10-CM | POA: Diagnosis not present

## 2014-12-17 DIAGNOSIS — N189 Chronic kidney disease, unspecified: Secondary | ICD-10-CM | POA: Diagnosis not present

## 2014-12-17 DIAGNOSIS — E875 Hyperkalemia: Secondary | ICD-10-CM | POA: Diagnosis not present

## 2014-12-20 DIAGNOSIS — M7542 Impingement syndrome of left shoulder: Secondary | ICD-10-CM | POA: Diagnosis not present

## 2014-12-20 DIAGNOSIS — J309 Allergic rhinitis, unspecified: Secondary | ICD-10-CM | POA: Diagnosis not present

## 2014-12-20 DIAGNOSIS — N189 Chronic kidney disease, unspecified: Secondary | ICD-10-CM | POA: Diagnosis not present

## 2014-12-20 DIAGNOSIS — I10 Essential (primary) hypertension: Secondary | ICD-10-CM | POA: Diagnosis not present

## 2015-01-04 DIAGNOSIS — E875 Hyperkalemia: Secondary | ICD-10-CM | POA: Diagnosis not present

## 2015-01-04 DIAGNOSIS — N19 Unspecified kidney failure: Secondary | ICD-10-CM | POA: Diagnosis not present

## 2015-01-05 DIAGNOSIS — I10 Essential (primary) hypertension: Secondary | ICD-10-CM | POA: Diagnosis not present

## 2015-01-05 DIAGNOSIS — E782 Mixed hyperlipidemia: Secondary | ICD-10-CM | POA: Diagnosis not present

## 2015-01-05 DIAGNOSIS — Z23 Encounter for immunization: Secondary | ICD-10-CM | POA: Diagnosis not present

## 2015-01-05 DIAGNOSIS — E119 Type 2 diabetes mellitus without complications: Secondary | ICD-10-CM | POA: Diagnosis not present

## 2015-01-24 ENCOUNTER — Telehealth: Payer: Self-pay | Admitting: *Deleted

## 2015-01-26 NOTE — Telephone Encounter (Signed)
[name] - Dr. Maryelizabeth Rowan office request office notes, faxed to [phone], phone [phone].

## 2015-01-26 NOTE — Telephone Encounter (Signed)
Please take care of

## 2015-01-27 NOTE — Telephone Encounter (Signed)
Printed all ov notes from 12.04.2014 - present and faxed to Dr. Chauncy Passy office. -Completed.

## 2015-03-15 ENCOUNTER — Ambulatory Visit (INDEPENDENT_AMBULATORY_CARE_PROVIDER_SITE_OTHER): Payer: Commercial Managed Care - HMO | Admitting: Podiatry

## 2015-03-15 ENCOUNTER — Encounter: Payer: Self-pay | Admitting: Podiatry

## 2015-03-15 DIAGNOSIS — R6889 Other general symptoms and signs: Secondary | ICD-10-CM | POA: Diagnosis not present

## 2015-03-15 DIAGNOSIS — M79676 Pain in unspecified toe(s): Secondary | ICD-10-CM | POA: Diagnosis not present

## 2015-03-15 DIAGNOSIS — B351 Tinea unguium: Secondary | ICD-10-CM

## 2015-03-15 NOTE — Progress Notes (Signed)
Patient ID: Linda Lawson, female   DOB: 1936/03/04, 79 y.o.   MRN: [id number] Complaint:  Visit Type: Patient returns to my office for continued preventative foot care services. Complaint: Patient states" my nails have grown long and thick and become painful to walk and wear shoes" .Patient is a diabetic with no foot complications. The patient presents for preventative foot care services. No changes to ROS  Podiatric Exam: Vascular: dorsalis pedis and posterior tibial pulses are palpable bilateral. Capillary return is immediate. Temperature gradient is WNL. Skin turgor WNL  Sensorium: Normal Semmes Weinstein monofilament test. Normal tactile sensation bilaterally. Nail Exam: Pt has thick disfigured discolored nails with subungual debris noted bilateral entire nail hallux through fifth toenails Ulcer Exam: There is no evidence of ulcer or pre-ulcerative changes or infection. Orthopedic Exam: Muscle tone and strength are WNL. No limitations in general ROM. No crepitus or effusions noted. Foot type and digits show no abnormalities. Bony prominences are unremarkable. Skin: No Porokeratosis. No infection or ulcers  Diagnosis:  Onychomycosis, , Pain in right toe, pain in left toes  Treatment & Plan Procedures and Treatment: Consent by patient was obtained for treatment procedures. The patient understood the discussion of treatment and procedures well. All questions were answered thoroughly reviewed. Debridement of mycotic and hypertrophic toenails, 1 through 5 bilateral and clearing of subungual debris. No ulceration, no infection noted.  Return Visit-Office Procedure: Patient instructed to return to the office for a follow up visit 3 months for continued evaluation and treatment.

## 2015-04-06 DIAGNOSIS — N19 Unspecified kidney failure: Secondary | ICD-10-CM | POA: Diagnosis not present

## 2015-04-06 DIAGNOSIS — I10 Essential (primary) hypertension: Secondary | ICD-10-CM | POA: Diagnosis not present

## 2015-04-06 DIAGNOSIS — E119 Type 2 diabetes mellitus without complications: Secondary | ICD-10-CM | POA: Diagnosis not present

## 2015-04-06 DIAGNOSIS — E782 Mixed hyperlipidemia: Secondary | ICD-10-CM | POA: Diagnosis not present

## 2015-06-21 ENCOUNTER — Ambulatory Visit: Payer: Commercial Managed Care - HMO | Admitting: Podiatry

## 2015-06-21 DIAGNOSIS — R739 Hyperglycemia, unspecified: Secondary | ICD-10-CM | POA: Diagnosis not present

## 2015-06-21 DIAGNOSIS — I10 Essential (primary) hypertension: Secondary | ICD-10-CM | POA: Diagnosis not present

## 2015-06-21 DIAGNOSIS — E782 Mixed hyperlipidemia: Secondary | ICD-10-CM | POA: Diagnosis not present

## 2015-06-24 ENCOUNTER — Encounter: Payer: Self-pay | Admitting: Podiatry

## 2015-06-24 ENCOUNTER — Ambulatory Visit (INDEPENDENT_AMBULATORY_CARE_PROVIDER_SITE_OTHER): Payer: Commercial Managed Care - HMO | Admitting: Podiatry

## 2015-06-24 DIAGNOSIS — Q828 Other specified congenital malformations of skin: Secondary | ICD-10-CM

## 2015-06-24 DIAGNOSIS — M79676 Pain in unspecified toe(s): Secondary | ICD-10-CM | POA: Diagnosis not present

## 2015-06-24 DIAGNOSIS — B351 Tinea unguium: Secondary | ICD-10-CM

## 2015-06-24 NOTE — Progress Notes (Signed)
Patient ID: Linda Lawson, female   DOB: 03-19-1936, 80 y.o.   MRN: [id number] Complaint:  Visit Type: Patient returns to my office for continued preventative foot care services. Complaint: Patient states" my nails have grown long and thick and become painful to walk and wear shoes" .Patient is a diabetic with no foot complications. The patient presents for preventative foot care services. No changes to ROS  Podiatric Exam: Vascular: dorsalis pedis and posterior tibial pulses are palpable bilateral. Capillary return is immediate. Temperature gradient is WNL. Skin turgor WNL  Sensorium: Normal Semmes Weinstein monofilament test. Normal tactile sensation bilaterally. Nail Exam: Pt has thick disfigured discolored nails with subungual debris noted bilateral entire nail hallux through fifth toenails Ulcer Exam: There is no evidence of ulcer or pre-ulcerative changes or infection. Orthopedic Exam: Muscle tone and strength are WNL. No limitations in general ROM. No crepitus or effusions noted. Foot type and digits show no abnormalities. Bony prominences are unremarkable. Skin:  Porokeratosis left heel.. No infection or ulcers  Diagnosis:  Onychomycosis, , Pain in right toe, pain in left toes, Porokeratosis left foot  Treatment & Plan Procedures and Treatment: Consent by patient was obtained for treatment procedures. The patient understood the discussion of treatment and procedures well. All questions were answered thoroughly reviewed. Debridement of mycotic and hypertrophic toenails, 1 through 5 bilateral and clearing of subungual debris. No ulceration, no infection noted.  Return Visit-Office Procedure: Patient instructed to return to the office for a follow up visit 3 months for continued evaluation and treatment.   Helane Gunther DPM

## 2015-09-21 DIAGNOSIS — I10 Essential (primary) hypertension: Secondary | ICD-10-CM | POA: Diagnosis not present

## 2015-09-21 DIAGNOSIS — N19 Unspecified kidney failure: Secondary | ICD-10-CM | POA: Diagnosis not present

## 2015-09-21 DIAGNOSIS — E119 Type 2 diabetes mellitus without complications: Secondary | ICD-10-CM | POA: Diagnosis not present

## 2015-09-21 DIAGNOSIS — M25559 Pain in unspecified hip: Secondary | ICD-10-CM | POA: Diagnosis not present

## 2015-09-21 DIAGNOSIS — M7072 Other bursitis of hip, left hip: Secondary | ICD-10-CM | POA: Diagnosis not present

## 2015-09-21 DIAGNOSIS — E782 Mixed hyperlipidemia: Secondary | ICD-10-CM | POA: Diagnosis not present

## 2015-09-27 DIAGNOSIS — E119 Type 2 diabetes mellitus without complications: Secondary | ICD-10-CM | POA: Diagnosis not present

## 2015-09-28 DIAGNOSIS — E782 Mixed hyperlipidemia: Secondary | ICD-10-CM | POA: Diagnosis not present

## 2015-09-28 DIAGNOSIS — I10 Essential (primary) hypertension: Secondary | ICD-10-CM | POA: Diagnosis not present

## 2015-09-28 DIAGNOSIS — E119 Type 2 diabetes mellitus without complications: Secondary | ICD-10-CM | POA: Diagnosis not present

## 2015-09-28 DIAGNOSIS — N189 Chronic kidney disease, unspecified: Secondary | ICD-10-CM | POA: Diagnosis not present

## 2015-10-04 ENCOUNTER — Encounter: Payer: Self-pay | Admitting: Podiatry

## 2015-10-04 ENCOUNTER — Ambulatory Visit (INDEPENDENT_AMBULATORY_CARE_PROVIDER_SITE_OTHER): Payer: Commercial Managed Care - HMO | Admitting: Podiatry

## 2015-10-04 DIAGNOSIS — Q828 Other specified congenital malformations of skin: Secondary | ICD-10-CM

## 2015-10-04 DIAGNOSIS — B351 Tinea unguium: Secondary | ICD-10-CM

## 2015-10-04 DIAGNOSIS — E119 Type 2 diabetes mellitus without complications: Secondary | ICD-10-CM

## 2015-10-04 DIAGNOSIS — M79676 Pain in unspecified toe(s): Secondary | ICD-10-CM

## 2015-10-04 DIAGNOSIS — M2010 Hallux valgus (acquired), unspecified foot: Secondary | ICD-10-CM

## 2015-10-04 NOTE — Progress Notes (Signed)
Patient ID: Linda Lawson, female   DOB: [birth year], 80 y.o.   MRN: [id number] Complaint:  Visit Type: Patient returns to my office for continued preventative foot care services. Complaint: Patient states" my nails have grown long and thick and become painful to walk and wear shoes" .Patient is a diabetic with no foot complications. The patient presents for preventative foot care services. No changes to ROS  Podiatric Exam: Vascular: dorsalis pedis and posterior tibial pulses are palpable bilateral. Capillary return is immediate. Temperature gradient is WNL. Skin turgor WNL  Sensorium: Absent  Semmes Weinstein monofilament test. Normal tactile sensation bilaterally. Nail Exam: Pt has thick disfigured discolored nails with subungual debris noted bilateral entire nail hallux through fifth toenails Ulcer Exam: There is no evidence of ulcer or pre-ulcerative changes or infection. Orthopedic Exam: Muscle tone and strength are WNL. No limitations in general ROM. No crepitus or effusions noted. Foot type and digits show no abnormalities. HAV 1st MPJ  B/L Skin:  Porokeratosis left heel.. No infection or ulcers  Diagnosis:  Onychomycosis, , Pain in right toe, pain in left toes, Porokeratosis left foot  Treatment & Plan Procedures and Treatment: Consent by patient was obtained for treatment procedures. The patient understood the discussion of treatment and procedures well. All questions were answered thoroughly reviewed. Debridement of mycotic and hypertrophic toenails, 1 through 5 bilateral and clearing of subungual debris. No ulceration, no infection noted. Initiate diabetic shoes due to neuropathy and HAV  B/L Return Visit-Office Procedure: Patient instructed to return to the office for a follow up visit 3 months for continued evaluation and treatment.   Helane GuntherGregory Vonya Ohalloran DPM

## 2015-10-12 DIAGNOSIS — E119 Type 2 diabetes mellitus without complications: Secondary | ICD-10-CM | POA: Diagnosis not present

## 2015-10-24 ENCOUNTER — Other Ambulatory Visit: Payer: Self-pay | Admitting: Family Medicine

## 2015-10-24 ENCOUNTER — Ambulatory Visit
Admission: RE | Admit: 2015-10-24 | Discharge: 2015-10-24 | Disposition: A | Discharge status: Home / Self Care | Payer: Commercial Managed Care - HMO | Source: Ambulatory Visit | Attending: Family Medicine | Admitting: Family Medicine

## 2015-10-24 DIAGNOSIS — M1612 Unilateral primary osteoarthritis, left hip: Secondary | ICD-10-CM

## 2015-10-25 DIAGNOSIS — E119 Type 2 diabetes mellitus without complications: Secondary | ICD-10-CM | POA: Diagnosis not present

## 2015-10-25 DIAGNOSIS — M179 Osteoarthritis of knee, unspecified: Secondary | ICD-10-CM | POA: Diagnosis not present

## 2015-10-25 DIAGNOSIS — M7072 Other bursitis of hip, left hip: Secondary | ICD-10-CM | POA: Diagnosis not present

## 2015-10-25 DIAGNOSIS — M109 Gout, unspecified: Secondary | ICD-10-CM | POA: Diagnosis not present

## 2015-11-01 DIAGNOSIS — M25552 Pain in left hip: Secondary | ICD-10-CM | POA: Diagnosis not present

## 2015-11-01 DIAGNOSIS — R262 Difficulty in walking, not elsewhere classified: Secondary | ICD-10-CM | POA: Diagnosis not present

## 2015-11-01 DIAGNOSIS — M6281 Muscle weakness (generalized): Secondary | ICD-10-CM | POA: Diagnosis not present

## 2015-11-01 DIAGNOSIS — M25652 Stiffness of left hip, not elsewhere classified: Secondary | ICD-10-CM | POA: Diagnosis not present

## 2015-11-10 DIAGNOSIS — M25652 Stiffness of left hip, not elsewhere classified: Secondary | ICD-10-CM | POA: Diagnosis not present

## 2015-11-10 DIAGNOSIS — R262 Difficulty in walking, not elsewhere classified: Secondary | ICD-10-CM | POA: Diagnosis not present

## 2015-11-10 DIAGNOSIS — M6281 Muscle weakness (generalized): Secondary | ICD-10-CM | POA: Diagnosis not present

## 2015-11-10 DIAGNOSIS — M25552 Pain in left hip: Secondary | ICD-10-CM | POA: Diagnosis not present

## 2015-11-17 DIAGNOSIS — M25552 Pain in left hip: Secondary | ICD-10-CM | POA: Diagnosis not present

## 2015-11-17 DIAGNOSIS — R262 Difficulty in walking, not elsewhere classified: Secondary | ICD-10-CM | POA: Diagnosis not present

## 2015-11-17 DIAGNOSIS — M25652 Stiffness of left hip, not elsewhere classified: Secondary | ICD-10-CM | POA: Diagnosis not present

## 2015-11-17 DIAGNOSIS — M6281 Muscle weakness (generalized): Secondary | ICD-10-CM | POA: Diagnosis not present

## 2015-11-18 DIAGNOSIS — R262 Difficulty in walking, not elsewhere classified: Secondary | ICD-10-CM | POA: Diagnosis not present

## 2015-11-18 DIAGNOSIS — M25552 Pain in left hip: Secondary | ICD-10-CM | POA: Diagnosis not present

## 2015-11-18 DIAGNOSIS — M6281 Muscle weakness (generalized): Secondary | ICD-10-CM | POA: Diagnosis not present

## 2015-11-18 DIAGNOSIS — M25652 Stiffness of left hip, not elsewhere classified: Secondary | ICD-10-CM | POA: Diagnosis not present

## 2015-11-23 DIAGNOSIS — M25652 Stiffness of left hip, not elsewhere classified: Secondary | ICD-10-CM | POA: Diagnosis not present

## 2015-11-23 DIAGNOSIS — M25552 Pain in left hip: Secondary | ICD-10-CM | POA: Diagnosis not present

## 2015-11-23 DIAGNOSIS — M6281 Muscle weakness (generalized): Secondary | ICD-10-CM | POA: Diagnosis not present

## 2015-11-23 DIAGNOSIS — R262 Difficulty in walking, not elsewhere classified: Secondary | ICD-10-CM | POA: Diagnosis not present

## 2015-11-24 DIAGNOSIS — R262 Difficulty in walking, not elsewhere classified: Secondary | ICD-10-CM | POA: Diagnosis not present

## 2015-11-24 DIAGNOSIS — M6281 Muscle weakness (generalized): Secondary | ICD-10-CM | POA: Diagnosis not present

## 2015-11-24 DIAGNOSIS — M25652 Stiffness of left hip, not elsewhere classified: Secondary | ICD-10-CM | POA: Diagnosis not present

## 2015-11-24 DIAGNOSIS — M25552 Pain in left hip: Secondary | ICD-10-CM | POA: Diagnosis not present

## 2015-11-30 DIAGNOSIS — M25552 Pain in left hip: Secondary | ICD-10-CM | POA: Diagnosis not present

## 2015-11-30 DIAGNOSIS — M25652 Stiffness of left hip, not elsewhere classified: Secondary | ICD-10-CM | POA: Diagnosis not present

## 2015-11-30 DIAGNOSIS — M6281 Muscle weakness (generalized): Secondary | ICD-10-CM | POA: Diagnosis not present

## 2015-11-30 DIAGNOSIS — R262 Difficulty in walking, not elsewhere classified: Secondary | ICD-10-CM | POA: Diagnosis not present

## 2015-12-02 DIAGNOSIS — M6281 Muscle weakness (generalized): Secondary | ICD-10-CM | POA: Diagnosis not present

## 2015-12-02 DIAGNOSIS — M25652 Stiffness of left hip, not elsewhere classified: Secondary | ICD-10-CM | POA: Diagnosis not present

## 2015-12-02 DIAGNOSIS — R262 Difficulty in walking, not elsewhere classified: Secondary | ICD-10-CM | POA: Diagnosis not present

## 2015-12-02 DIAGNOSIS — M25552 Pain in left hip: Secondary | ICD-10-CM | POA: Diagnosis not present

## 2015-12-06 DIAGNOSIS — M6281 Muscle weakness (generalized): Secondary | ICD-10-CM | POA: Diagnosis not present

## 2015-12-06 DIAGNOSIS — M25552 Pain in left hip: Secondary | ICD-10-CM | POA: Diagnosis not present

## 2015-12-06 DIAGNOSIS — M25652 Stiffness of left hip, not elsewhere classified: Secondary | ICD-10-CM | POA: Diagnosis not present

## 2015-12-06 DIAGNOSIS — R262 Difficulty in walking, not elsewhere classified: Secondary | ICD-10-CM | POA: Diagnosis not present

## 2015-12-08 DIAGNOSIS — M25552 Pain in left hip: Secondary | ICD-10-CM | POA: Diagnosis not present

## 2015-12-08 DIAGNOSIS — M6281 Muscle weakness (generalized): Secondary | ICD-10-CM | POA: Diagnosis not present

## 2015-12-08 DIAGNOSIS — M25652 Stiffness of left hip, not elsewhere classified: Secondary | ICD-10-CM | POA: Diagnosis not present

## 2015-12-08 DIAGNOSIS — R262 Difficulty in walking, not elsewhere classified: Secondary | ICD-10-CM | POA: Diagnosis not present

## 2015-12-13 DIAGNOSIS — M25552 Pain in left hip: Secondary | ICD-10-CM | POA: Diagnosis not present

## 2015-12-13 DIAGNOSIS — M6281 Muscle weakness (generalized): Secondary | ICD-10-CM | POA: Diagnosis not present

## 2015-12-13 DIAGNOSIS — R262 Difficulty in walking, not elsewhere classified: Secondary | ICD-10-CM | POA: Diagnosis not present

## 2015-12-13 DIAGNOSIS — M25652 Stiffness of left hip, not elsewhere classified: Secondary | ICD-10-CM | POA: Diagnosis not present

## 2015-12-15 DIAGNOSIS — R262 Difficulty in walking, not elsewhere classified: Secondary | ICD-10-CM | POA: Diagnosis not present

## 2015-12-15 DIAGNOSIS — M25652 Stiffness of left hip, not elsewhere classified: Secondary | ICD-10-CM | POA: Diagnosis not present

## 2015-12-15 DIAGNOSIS — M6281 Muscle weakness (generalized): Secondary | ICD-10-CM | POA: Diagnosis not present

## 2015-12-15 DIAGNOSIS — M25552 Pain in left hip: Secondary | ICD-10-CM | POA: Diagnosis not present

## 2015-12-19 DIAGNOSIS — E119 Type 2 diabetes mellitus without complications: Secondary | ICD-10-CM | POA: Diagnosis not present

## 2015-12-20 DIAGNOSIS — M25652 Stiffness of left hip, not elsewhere classified: Secondary | ICD-10-CM | POA: Diagnosis not present

## 2015-12-20 DIAGNOSIS — R262 Difficulty in walking, not elsewhere classified: Secondary | ICD-10-CM | POA: Diagnosis not present

## 2015-12-20 DIAGNOSIS — M6281 Muscle weakness (generalized): Secondary | ICD-10-CM | POA: Diagnosis not present

## 2015-12-20 DIAGNOSIS — M25552 Pain in left hip: Secondary | ICD-10-CM | POA: Diagnosis not present

## 2015-12-21 DIAGNOSIS — I10 Essential (primary) hypertension: Secondary | ICD-10-CM | POA: Diagnosis not present

## 2015-12-21 DIAGNOSIS — M7072 Other bursitis of hip, left hip: Secondary | ICD-10-CM | POA: Diagnosis not present

## 2015-12-21 DIAGNOSIS — E119 Type 2 diabetes mellitus without complications: Secondary | ICD-10-CM | POA: Diagnosis not present

## 2015-12-21 DIAGNOSIS — E782 Mixed hyperlipidemia: Secondary | ICD-10-CM | POA: Diagnosis not present

## 2015-12-22 DIAGNOSIS — R262 Difficulty in walking, not elsewhere classified: Secondary | ICD-10-CM | POA: Diagnosis not present

## 2015-12-22 DIAGNOSIS — M6281 Muscle weakness (generalized): Secondary | ICD-10-CM | POA: Diagnosis not present

## 2015-12-22 DIAGNOSIS — M25652 Stiffness of left hip, not elsewhere classified: Secondary | ICD-10-CM | POA: Diagnosis not present

## 2015-12-22 DIAGNOSIS — M25552 Pain in left hip: Secondary | ICD-10-CM | POA: Diagnosis not present

## 2016-01-03 ENCOUNTER — Ambulatory Visit: Payer: Commercial Managed Care - HMO | Admitting: Podiatry

## 2016-01-10 DIAGNOSIS — M109 Gout, unspecified: Secondary | ICD-10-CM | POA: Diagnosis not present

## 2016-01-10 DIAGNOSIS — N183 Chronic kidney disease, stage 3 (moderate): Secondary | ICD-10-CM | POA: Diagnosis not present

## 2016-01-10 DIAGNOSIS — E1129 Type 2 diabetes mellitus with other diabetic kidney complication: Secondary | ICD-10-CM | POA: Diagnosis not present

## 2016-01-10 DIAGNOSIS — I129 Hypertensive chronic kidney disease with stage 1 through stage 4 chronic kidney disease, or unspecified chronic kidney disease: Secondary | ICD-10-CM | POA: Diagnosis not present

## 2016-01-10 DIAGNOSIS — Z6833 Body mass index (BMI) 33.0-33.9, adult: Secondary | ICD-10-CM | POA: Diagnosis not present

## 2016-01-10 DIAGNOSIS — E785 Hyperlipidemia, unspecified: Secondary | ICD-10-CM | POA: Diagnosis not present

## 2016-01-10 DIAGNOSIS — D631 Anemia in chronic kidney disease: Secondary | ICD-10-CM | POA: Diagnosis not present

## 2016-01-17 ENCOUNTER — Encounter: Payer: Self-pay | Admitting: Podiatry

## 2016-01-17 ENCOUNTER — Ambulatory Visit (INDEPENDENT_AMBULATORY_CARE_PROVIDER_SITE_OTHER): Payer: Commercial Managed Care - HMO | Admitting: Podiatry

## 2016-01-17 DIAGNOSIS — M79676 Pain in unspecified toe(s): Secondary | ICD-10-CM

## 2016-01-17 DIAGNOSIS — Q828 Other specified congenital malformations of skin: Secondary | ICD-10-CM | POA: Diagnosis not present

## 2016-01-17 DIAGNOSIS — B351 Tinea unguium: Secondary | ICD-10-CM | POA: Diagnosis not present

## 2016-01-17 NOTE — Progress Notes (Signed)
Patient ID: Linda Lawson, female   DOB: 1935-07-10, 80 y.o.   MRN: [id number] Complaint:  Visit Type: Patient returns to my office for continued preventative foot care services. Complaint: Patient states" my nails have grown long and thick and become painful to walk and wear shoes" .Patient is a diabetic with no foot complications. The patient presents for preventative foot care services. No changes to ROS  Podiatric Exam: Vascular: dorsalis pedis and posterior tibial pulses are palpable bilateral. Capillary return is immediate. Temperature gradient is WNL. Skin turgor WNL  Sensorium: Absent  Semmes Weinstein monofilament test. Normal tactile sensation bilaterally. Nail Exam: Pt has thick disfigured discolored nails with subungual debris noted bilateral entire nail hallux through fifth toenails Ulcer Exam: There is no evidence of ulcer or pre-ulcerative changes or infection. Orthopedic Exam: Muscle tone and strength are WNL. No limitations in general ROM. No crepitus or effusions noted. Foot type and digits show no abnormalities. HAV 1st MPJ  B/L Skin:  Porokeratosis left heel.. No infection or ulcers  Diagnosis:  Onychomycosis, , Pain in right toe, pain in left toes, Porokeratosis left foot  Treatment & Plan Procedures and Treatment: Consent by patient was obtained for treatment procedures. The patient understood the discussion of treatment and procedures well. All questions were answered thoroughly reviewed. Debridement of mycotic and hypertrophic toenails, 1 through 5 bilateral and clearing of subungual debris. No ulceration, no infection noted. Initiate diabetic shoes due to neuropathy and HAV  B/L Return Visit-Office Procedure: Patient instructed to return to the office for a follow up visit 3 months for continued evaluation and treatment.   Helane GuntherGregory Starlyn Droge DPM

## 2016-02-13 ENCOUNTER — Ambulatory Visit (INDEPENDENT_AMBULATORY_CARE_PROVIDER_SITE_OTHER): Payer: Commercial Managed Care - HMO | Admitting: Orthopaedic Surgery

## 2016-02-13 DIAGNOSIS — M1612 Unilateral primary osteoarthritis, left hip: Secondary | ICD-10-CM | POA: Diagnosis not present

## 2016-02-24 ENCOUNTER — Ambulatory Visit: Payer: Commercial Managed Care - HMO | Admitting: *Deleted

## 2016-02-24 DIAGNOSIS — E119 Type 2 diabetes mellitus without complications: Secondary | ICD-10-CM

## 2016-02-24 NOTE — Progress Notes (Signed)
Patient ID: Linda Lawson, female   DOB: 11-26-1935, 80 y.o.   MRN: [id number]  Patient presents to be scanned and measured for diabetic shoes and inserts.

## 2016-03-01 DIAGNOSIS — Z952 Presence of prosthetic heart valve: Secondary | ICD-10-CM | POA: Diagnosis not present

## 2016-03-01 DIAGNOSIS — I38 Endocarditis, valve unspecified: Secondary | ICD-10-CM | POA: Insufficient documentation

## 2016-03-01 DIAGNOSIS — I491 Atrial premature depolarization: Secondary | ICD-10-CM | POA: Diagnosis not present

## 2016-03-01 DIAGNOSIS — Z01810 Encounter for preprocedural cardiovascular examination: Secondary | ICD-10-CM | POA: Diagnosis not present

## 2016-03-01 DIAGNOSIS — R6889 Other general symptoms and signs: Secondary | ICD-10-CM | POA: Diagnosis not present

## 2016-03-13 DIAGNOSIS — R6889 Other general symptoms and signs: Secondary | ICD-10-CM | POA: Diagnosis not present

## 2016-03-13 DIAGNOSIS — Z01810 Encounter for preprocedural cardiovascular examination: Secondary | ICD-10-CM | POA: Diagnosis not present

## 2016-03-13 DIAGNOSIS — Z952 Presence of prosthetic heart valve: Secondary | ICD-10-CM | POA: Diagnosis not present

## 2016-03-16 ENCOUNTER — Telehealth (INDEPENDENT_AMBULATORY_CARE_PROVIDER_SITE_OTHER): Payer: Self-pay | Admitting: Orthopaedic Surgery

## 2016-03-16 NOTE — Telephone Encounter (Signed)
Left voice mail message for patient to call me back to schedule surgery. 

## 2016-03-23 ENCOUNTER — Telehealth (INDEPENDENT_AMBULATORY_CARE_PROVIDER_SITE_OTHER): Payer: Self-pay | Admitting: *Deleted

## 2016-03-23 ENCOUNTER — Ambulatory Visit (INDEPENDENT_AMBULATORY_CARE_PROVIDER_SITE_OTHER): Payer: Commercial Managed Care - HMO | Admitting: Podiatry

## 2016-03-23 ENCOUNTER — Encounter: Payer: Self-pay | Admitting: Podiatry

## 2016-03-23 DIAGNOSIS — Q828 Other specified congenital malformations of skin: Secondary | ICD-10-CM | POA: Diagnosis not present

## 2016-03-23 DIAGNOSIS — M2010 Hallux valgus (acquired), unspecified foot: Secondary | ICD-10-CM

## 2016-03-23 DIAGNOSIS — E119 Type 2 diabetes mellitus without complications: Secondary | ICD-10-CM

## 2016-03-23 NOTE — Telephone Encounter (Signed)
Misty StanleyLisa calling requesting office notes from 10/2 sent to Dr. Orland Deceweys office. Fax [phone]

## 2016-03-23 NOTE — Progress Notes (Signed)
Patient ID: Linda Lawson, female   DOB: 1935-06-16, 80 y.o.   MRN: [id number] Complaint:  Visit Type: Patient returns to my office to pick up her jnew diabetic shoes.  Podiatric Exam: Vascular: dorsalis pedis and posterior tibial pulses are palpable bilateral. Capillary return is immediate. Temperature gradient is WNL. Skin turgor WNL  Sensorium: Absent  Semmes Weinstein monofilament test. Normal tactile sensation bilaterally. Nail Exam: Pt has thick disfigured discolored nails with subungual debris noted bilateral entire nail hallux through fifth toenails Ulcer Exam: There is no evidence of ulcer or pre-ulcerative changes or infection. Orthopedic Exam: Muscle tone and strength are WNL. No limitations in general ROM. No crepitus or effusions noted. Foot type and digits show no abnormalities. HAV 1st MPJ  B/L Skin:  Porokeratosis left heel.. No infection or ulcers  Diagnosis:  Diabetes with neuropathy  HAV   B/L  Treatment & Plan Procedures and Treatment: Diabetic shoes were dispensed.  Patient presents today and was dispensed 0ne pair ( two units) of medically necessary extra depth shoes with three pair( six units) of custom molded multiple density inserts. The shoes and the inserts are fitted to the patients ' feet and are noted to fit well and are free of defect.  Length and width of the shoes are also acceptable.  Patient was given written and verbal  instructions for wearing.  If any concerns arrive with the shoes or inserts, the patient is to call the office.Patient is to follow up with doctor in six weeks. Return Visit-Office Procedure: Patient instructed to return to the office for a follow up  for continued nail care.   Linda Lawson DPM

## 2016-03-27 DIAGNOSIS — M179 Osteoarthritis of knee, unspecified: Secondary | ICD-10-CM | POA: Diagnosis not present

## 2016-03-27 DIAGNOSIS — E119 Type 2 diabetes mellitus without complications: Secondary | ICD-10-CM | POA: Diagnosis not present

## 2016-03-27 DIAGNOSIS — I10 Essential (primary) hypertension: Secondary | ICD-10-CM | POA: Diagnosis not present

## 2016-03-27 DIAGNOSIS — N39490 Overflow incontinence: Secondary | ICD-10-CM | POA: Diagnosis not present

## 2016-03-27 NOTE — Telephone Encounter (Signed)
Faxed to [phone]

## 2016-03-28 NOTE — Telephone Encounter (Signed)
Patient called me back and left voice mail to return her call.  I called her today and left voice mail for her to return my call to schedule surgery.

## 2016-04-04 ENCOUNTER — Other Ambulatory Visit (INDEPENDENT_AMBULATORY_CARE_PROVIDER_SITE_OTHER): Payer: Self-pay | Admitting: Orthopaedic Surgery

## 2016-04-04 DIAGNOSIS — M1612 Unilateral primary osteoarthritis, left hip: Secondary | ICD-10-CM

## 2016-04-17 ENCOUNTER — Encounter (HOSPITAL_COMMUNITY): Payer: Self-pay

## 2016-04-17 ENCOUNTER — Encounter (HOSPITAL_COMMUNITY)
Admission: RE | Admit: 2016-04-17 | Discharge: 2016-04-17 | Disposition: A | Discharge status: Home / Self Care | Payer: Commercial Managed Care - HMO | Source: Ambulatory Visit | Attending: Orthopaedic Surgery | Admitting: Orthopaedic Surgery

## 2016-04-17 DIAGNOSIS — Z952 Presence of prosthetic heart valve: Secondary | ICD-10-CM

## 2016-04-17 DIAGNOSIS — E119 Type 2 diabetes mellitus without complications: Secondary | ICD-10-CM

## 2016-04-17 DIAGNOSIS — Z791 Long term (current) use of non-steroidal anti-inflammatories (NSAID): Secondary | ICD-10-CM | POA: Diagnosis not present

## 2016-04-17 DIAGNOSIS — Z0183 Encounter for blood typing: Secondary | ICD-10-CM | POA: Insufficient documentation

## 2016-04-17 DIAGNOSIS — Z885 Allergy status to narcotic agent status: Secondary | ICD-10-CM | POA: Diagnosis not present

## 2016-04-17 DIAGNOSIS — F339 Major depressive disorder, recurrent, unspecified: Secondary | ICD-10-CM | POA: Diagnosis not present

## 2016-04-17 DIAGNOSIS — Z7982 Long term (current) use of aspirin: Secondary | ICD-10-CM

## 2016-04-17 DIAGNOSIS — Z471 Aftercare following joint replacement surgery: Secondary | ICD-10-CM | POA: Diagnosis not present

## 2016-04-17 DIAGNOSIS — R2689 Other abnormalities of gait and mobility: Secondary | ICD-10-CM | POA: Diagnosis not present

## 2016-04-17 DIAGNOSIS — M1990 Unspecified osteoarthritis, unspecified site: Secondary | ICD-10-CM | POA: Diagnosis not present

## 2016-04-17 DIAGNOSIS — Z87891 Personal history of nicotine dependence: Secondary | ICD-10-CM

## 2016-04-17 DIAGNOSIS — I10 Essential (primary) hypertension: Secondary | ICD-10-CM

## 2016-04-17 DIAGNOSIS — M6281 Muscle weakness (generalized): Secondary | ICD-10-CM | POA: Diagnosis not present

## 2016-04-17 DIAGNOSIS — Z01818 Encounter for other preprocedural examination: Secondary | ICD-10-CM | POA: Insufficient documentation

## 2016-04-17 DIAGNOSIS — Z01812 Encounter for preprocedural laboratory examination: Secondary | ICD-10-CM

## 2016-04-17 DIAGNOSIS — Z96641 Presence of right artificial hip joint: Secondary | ICD-10-CM | POA: Diagnosis present

## 2016-04-17 DIAGNOSIS — Z79899 Other long term (current) drug therapy: Secondary | ICD-10-CM

## 2016-04-17 DIAGNOSIS — R011 Cardiac murmur, unspecified: Secondary | ICD-10-CM | POA: Diagnosis present

## 2016-04-17 DIAGNOSIS — D62 Acute posthemorrhagic anemia: Secondary | ICD-10-CM | POA: Diagnosis not present

## 2016-04-17 DIAGNOSIS — F329 Major depressive disorder, single episode, unspecified: Secondary | ICD-10-CM | POA: Diagnosis not present

## 2016-04-17 DIAGNOSIS — E785 Hyperlipidemia, unspecified: Secondary | ICD-10-CM

## 2016-04-17 DIAGNOSIS — Z96652 Presence of left artificial knee joint: Secondary | ICD-10-CM | POA: Diagnosis present

## 2016-04-17 DIAGNOSIS — Z880 Allergy status to penicillin: Secondary | ICD-10-CM | POA: Diagnosis not present

## 2016-04-17 DIAGNOSIS — M1612 Unilateral primary osteoarthritis, left hip: Secondary | ICD-10-CM

## 2016-04-17 DIAGNOSIS — Z794 Long term (current) use of insulin: Secondary | ICD-10-CM | POA: Diagnosis not present

## 2016-04-17 DIAGNOSIS — Z96642 Presence of left artificial hip joint: Secondary | ICD-10-CM | POA: Diagnosis not present

## 2016-04-17 DIAGNOSIS — M109 Gout, unspecified: Secondary | ICD-10-CM | POA: Diagnosis present

## 2016-04-17 DIAGNOSIS — M25559 Pain in unspecified hip: Secondary | ICD-10-CM | POA: Diagnosis not present

## 2016-04-17 HISTORY — DX: Unspecified osteoarthritis, unspecified site: M19.90

## 2016-04-17 HISTORY — DX: Other complications of anesthesia, initial encounter: T88.59XA

## 2016-04-17 HISTORY — DX: Adverse effect of unspecified anesthetic, initial encounter: T41.45XA

## 2016-04-17 HISTORY — DX: Nausea with vomiting, unspecified: R11.2

## 2016-04-17 HISTORY — DX: Other specified postprocedural states: Z98.890

## 2016-04-17 HISTORY — DX: Gout, unspecified: M10.9

## 2016-04-17 HISTORY — DX: Pneumonia, unspecified organism: J18.9

## 2016-04-17 HISTORY — DX: Depression, unspecified: F32.A

## 2016-04-17 HISTORY — DX: Major depressive disorder, single episode, unspecified: F32.9

## 2016-04-17 LAB — URINALYSIS, ROUTINE W REFLEX MICROSCOPIC
BILIRUBIN URINE: NEGATIVE
GLUCOSE, UA: NEGATIVE mg/dL
HGB URINE DIPSTICK: NEGATIVE
KETONES UR: NEGATIVE mg/dL
Leukocytes, UA: NEGATIVE
Nitrite: NEGATIVE
PROTEIN: NEGATIVE mg/dL
Specific Gravity, Urine: 1.008 (ref 1.005–1.030)
pH: 5 (ref 5.0–8.0)

## 2016-04-17 LAB — CBC WITH DIFFERENTIAL/PLATELET
BASOS PCT: 0 %
Basophils Absolute: 0 10*3/uL (ref 0.0–0.1)
EOS ABS: 0.2 10*3/uL (ref 0.0–0.7)
EOS PCT: 2 %
HCT: 33 % — ABNORMAL LOW (ref 36.0–46.0)
Hemoglobin: 10.4 g/dL — ABNORMAL LOW (ref 12.0–15.0)
LYMPHS ABS: 2.5 10*3/uL (ref 0.7–4.0)
Lymphocytes Relative: 29 %
MCH: 23.9 pg — AB (ref 26.0–34.0)
MCHC: 31.5 g/dL (ref 30.0–36.0)
MCV: 75.7 fL — ABNORMAL LOW (ref 78.0–100.0)
MONOS PCT: 5 %
Monocytes Absolute: 0.5 10*3/uL (ref 0.1–1.0)
Neutro Abs: 5.5 10*3/uL (ref 1.7–7.7)
Neutrophils Relative %: 64 %
PLATELETS: 276 10*3/uL (ref 150–400)
RBC: 4.36 MIL/uL (ref 3.87–5.11)
RDW: 13.6 % (ref 11.5–15.5)
WBC: 8.6 10*3/uL (ref 4.0–10.5)

## 2016-04-17 LAB — TYPE AND SCREEN
ABO/RH(D): B POS
ANTIBODY SCREEN: NEGATIVE

## 2016-04-17 LAB — GLUCOSE, CAPILLARY: GLUCOSE-CAPILLARY: 172 mg/dL — AB (ref 65–99)

## 2016-04-17 LAB — COMPREHENSIVE METABOLIC PANEL
ALBUMIN: 3.7 g/dL (ref 3.5–5.0)
ALT: 18 U/L (ref 14–54)
ANION GAP: 5 (ref 5–15)
AST: 22 U/L (ref 15–41)
Alkaline Phosphatase: 76 U/L (ref 38–126)
BUN: 14 mg/dL (ref 6–20)
CHLORIDE: 108 mmol/L (ref 101–111)
CO2: 25 mmol/L (ref 22–32)
Calcium: 9.3 mg/dL (ref 8.9–10.3)
Creatinine, Ser: 0.97 mg/dL (ref 0.44–1.00)
GFR calc non Af Amer: 55 mL/min — ABNORMAL LOW (ref >60)
Glucose, Bld: 129 mg/dL — ABNORMAL HIGH (ref 65–99)
Potassium: 4.3 mmol/L (ref 3.5–5.1)
SODIUM: 138 mmol/L (ref 135–145)
Total Bilirubin: 0.4 mg/dL (ref 0.3–1.2)
Total Protein: 6.6 g/dL (ref 6.5–8.1)

## 2016-04-17 LAB — PROTIME-INR
INR: 1.02
Prothrombin Time: 13.4 seconds (ref 11.4–15.2)

## 2016-04-17 LAB — SURGICAL PCR SCREEN
MRSA, PCR: NEGATIVE
STAPHYLOCOCCUS AUREUS: NEGATIVE

## 2016-04-17 LAB — SEDIMENTATION RATE: SED RATE: 42 mm/h — AB (ref 0–22)

## 2016-04-17 LAB — C-REACTIVE PROTEIN: CRP: 3.5 mg/dL — ABNORMAL HIGH (ref <1.0)

## 2016-04-17 LAB — APTT: aPTT: 25 seconds (ref 24–36)

## 2016-04-17 NOTE — Progress Notes (Addendum)
   How to Manage Your Diabetes Before and After Surgery  Why is it important to control my blood sugar before and after surgery? . Improving blood sugar levels before and after surgery helps healing and can limit problems. . A way of improving blood sugar control is eating a healthy diet by: o  Eating less sugar and carbohydrates o  Increasing activity/exercise o  Talking with your doctor about reaching your blood sugar goals . High blood sugars (greater than 180 mg/dL) can raise your risk of infections and slow your recovery, so you will need to focus on controlling your diabetes during the weeks before surgery. . Make sure that the doctor who takes care of your diabetes knows about your planned surgery including the date and location.  How do I manage my blood sugar before surgery? . Check your blood sugar at least 4 times a day, starting 2 days before surgery, to make sure that the level is not too high or low. o Check your blood sugar the morning of your surgery when you wake up and every 2 hours until you get to the Short Stay unit. . If your blood sugar is less than 70 mg/dL, you will need to treat for low blood sugar: o Do not take insulin. o Treat a low blood sugar (less than 70 mg/dL) with  cup of clear juice (cranberry or apple), 4 glucose tablets, OR glucose gel. o Recheck blood sugar in 15 minutes after treatment (to make sure it is greater than 70 mg/dL). If your blood sugar is not greater than 70 mg/dL on recheck, call [phone] for further instructions. . Report your blood sugar to the short stay nurse when you get to Short Stay.  . If you are admitted to the hospital after surgery: o Your blood sugar will be checked by the staff and you will probably be given insulin after surgery (instead of oral diabetes medicines) to make sure you have good blood sugar levels. o The goal for blood sugar control after surgery is 80-180 mg/dL.       WHAT DO I DO ABOUT MY DIABETES  MEDICATION  . Do not take oral diabetes medicines (pills) the morning of surgery.       . THE MORNING OF SURGERY, take  5 units of  Lantus_insulin.

## 2016-04-17 NOTE — Pre-Procedure Instructions (Signed)
    Wallis Bambergvelyn D Menn  04/17/2016     Your procedure is scheduled on Thursday, December 7.  [facility]15 [facility]                Your surgery or procedure is scheduled for 12:15 PM   Call this number if you have problems the morning of surgery:[phone]                   Remember:  Do not eat food or drink liquids after midnight Wednesday, December 6.  Take these medicines the morning of surgery with A SIP OF WATER: allopurinol (ZYLOPRIM).                DO NOT take Tradjenta the morning of surgery.              See information regarding Lantus Insulin on separate sheet.   Do not wear jewelry, make-up or nail polish.  Do not wear lotions, powders, or perfumes, or deodorant.  Do not shave 48 hours prior to surgery.    Do not bring valuables to the hospital.  [facility] any belongings or valuables.  Contacts, dentures or bridgework may not be worn into surgery.  Leave your suitcase in the car.  After surgery it may be brought to your room.  For patients admitted to the hospital, discharge time will be determined by your treatment team.  Special instructions: Review  [place] - Preparing For Surgery.  Please read over the following fact sheets that you were given. [place]- Preparing For Surgery and Patient Instructions for Mupirocin Application, Pain Booklet

## 2016-04-18 ENCOUNTER — Encounter (HOSPITAL_COMMUNITY): Payer: Self-pay

## 2016-04-18 LAB — HEMOGLOBIN A1C
HEMOGLOBIN A1C: 7.9 % — AB (ref 4.8–5.6)
MEAN PLASMA GLUCOSE: 180 mg/dL

## 2016-04-18 MED ORDER — TRANEXAMIC ACID 1000 MG/10ML IV SOLN
1000.0000 mg | INTRAVENOUS | Status: AC
  Administered 2016-04-19: 1000 mg via INTRAVENOUS
  Filled 2016-04-18 (×2): qty 10

## 2016-04-18 NOTE — Progress Notes (Signed)
Anesthesia Chart Review: Patient is a 80 year old female scheduled for left THA, anterior approach on 04/19/16 by Dr. Roda ShuttersXu.  History includes post-operative N/V, former smoker, s/p AVR (pericardial tissue) '10, HLD, HTN, DOE, DM2, depression, gout, arthritis, left TKA '12.  Meds include allopurinol, aspirin 81 mg, Lantus, lisinopril, Claritin, pravastatin, Tradjenta.  PCP is Dr. Maryelizabeth RowanElizabeth Dewey. Cardiologist is Dr. Heide ScalesHerman Cheek (see Care Everywhere). He saw patient on 03/01/16 and felt she would be "Low overall cardiovascular risk."  BP (!) 152/64   Pulse 61   Temp 36.8 C (Oral)   Resp 20   Ht 5\' 4"  (1.626 m)   Wt 187 lb 6.3 oz (85 kg)   SpO2 100%   BMI 32.17 kg/m   03/01/16 EKG (Dr Beverely Paceheek): Normal sinus rhythm, frequent PACs, LVH with secondary T-wave changes (inferior leads and anterolateral leads V3-6).  03/13/16 Echo (Dr. Beverely Paceheek): Conclusion: 1. Left ventricle cavity is normal in size. 2. Normal global wall motion. 3. Calculated EF 65%. 4. Mild concentric LVH. 5. AVR appears well-seated, gradient 40 mm [36.0 mmHg] peak, 20 mm [18.0 mmHg] mean. AV peak velocity 3.01 m/s, AV VTI 65.7 cm , AVA (Mvax) 1.05 cm2, AVA (VTI) 1.25 cm2. No aortic regurgitation. 6. Ascending aorta not well seen-appears dilated. 7. RA is mildly dilated. 8. Mild mitral regurgitation. 9. Mild tricuspid regurgitation-RVSP mildly elevated.  06/28/10 Stress Echo ([facility]; scanned under Media tab, Correspondence, 10/10/10): Summary: Fair exercise tolerance 4 METS--poor cardiovascular conditioning suggested. Normal resting LV systolic function with mild LV hypertrophy. Normal blood pressure response to exercise. Normal stress echo and nondiagnostic EKG, no evidence of ischemia.  07/13/08 PRE-AVR Cardiac cath ([facility]; scanned under Media tab, Correspondence, 10/10/10): Conclusions: 1. Minimal CAD with normal LV function. Right heart cath 2. RHC: RA 6/7, RV 29/5, PA 27/14, wedge 7, CO by flick 3.65 (Cl 2), mean AV gradient 33  mmHg, AVA 0.84. 3. Moderately Severe AS. (Subsequently underwent AVR.)  Preoperative labs noted. H/H 10.4/33.0. Cr 0.97. PT/PTT WNL. A1c 7.9. T&S done. UA negative leukocytes, nitrites.  If no acute changes then I anticipate that she can proceed as planned.  Velna Ochsllison Tabytha Gradillas, PA-C [facility]Anesthesiology Phone [phone] 04/18/2016 11:41 AM

## 2016-04-19 ENCOUNTER — Inpatient Hospital Stay (HOSPITAL_COMMUNITY): Payer: Commercial Managed Care - HMO | Admitting: Certified Registered Nurse Anesthetist

## 2016-04-19 ENCOUNTER — Inpatient Hospital Stay (HOSPITAL_COMMUNITY): Payer: Commercial Managed Care - HMO

## 2016-04-19 ENCOUNTER — Inpatient Hospital Stay (HOSPITAL_COMMUNITY)
Admission: RE | Admit: 2016-04-19 | Discharge: 2016-04-21 | DRG: 470 | Disposition: A | Discharge status: Skilled Nursing Facility | Payer: Commercial Managed Care - HMO | Source: Ambulatory Visit | Attending: Orthopaedic Surgery | Admitting: Orthopaedic Surgery

## 2016-04-19 ENCOUNTER — Encounter (HOSPITAL_COMMUNITY): Payer: Self-pay | Admitting: Surgery

## 2016-04-19 ENCOUNTER — Encounter (HOSPITAL_COMMUNITY)
Admission: RE | Disposition: A | Discharge status: Skilled Nursing Facility | Payer: Self-pay | Source: Ambulatory Visit | Attending: Orthopaedic Surgery

## 2016-04-19 ENCOUNTER — Inpatient Hospital Stay (HOSPITAL_COMMUNITY): Payer: Commercial Managed Care - HMO | Admitting: Emergency Medicine

## 2016-04-19 DIAGNOSIS — E119 Type 2 diabetes mellitus without complications: Secondary | ICD-10-CM | POA: Diagnosis present

## 2016-04-19 DIAGNOSIS — D62 Acute posthemorrhagic anemia: Secondary | ICD-10-CM | POA: Diagnosis not present

## 2016-04-19 DIAGNOSIS — I10 Essential (primary) hypertension: Secondary | ICD-10-CM | POA: Diagnosis present

## 2016-04-19 DIAGNOSIS — Z79899 Other long term (current) drug therapy: Secondary | ICD-10-CM | POA: Diagnosis not present

## 2016-04-19 DIAGNOSIS — M1612 Unilateral primary osteoarthritis, left hip: Principal | ICD-10-CM

## 2016-04-19 DIAGNOSIS — Z794 Long term (current) use of insulin: Secondary | ICD-10-CM

## 2016-04-19 DIAGNOSIS — R011 Cardiac murmur, unspecified: Secondary | ICD-10-CM | POA: Diagnosis present

## 2016-04-19 DIAGNOSIS — Z885 Allergy status to narcotic agent status: Secondary | ICD-10-CM

## 2016-04-19 DIAGNOSIS — Z791 Long term (current) use of non-steroidal anti-inflammatories (NSAID): Secondary | ICD-10-CM | POA: Diagnosis not present

## 2016-04-19 DIAGNOSIS — M109 Gout, unspecified: Secondary | ICD-10-CM | POA: Diagnosis present

## 2016-04-19 DIAGNOSIS — Z96641 Presence of right artificial hip joint: Secondary | ICD-10-CM | POA: Diagnosis present

## 2016-04-19 DIAGNOSIS — Z471 Aftercare following joint replacement surgery: Secondary | ICD-10-CM | POA: Diagnosis not present

## 2016-04-19 DIAGNOSIS — Z7982 Long term (current) use of aspirin: Secondary | ICD-10-CM | POA: Diagnosis not present

## 2016-04-19 DIAGNOSIS — Z96649 Presence of unspecified artificial hip joint: Secondary | ICD-10-CM

## 2016-04-19 DIAGNOSIS — Z419 Encounter for procedure for purposes other than remedying health state, unspecified: Secondary | ICD-10-CM

## 2016-04-19 DIAGNOSIS — Z952 Presence of prosthetic heart valve: Secondary | ICD-10-CM | POA: Diagnosis not present

## 2016-04-19 DIAGNOSIS — Z87891 Personal history of nicotine dependence: Secondary | ICD-10-CM

## 2016-04-19 DIAGNOSIS — E785 Hyperlipidemia, unspecified: Secondary | ICD-10-CM | POA: Diagnosis present

## 2016-04-19 DIAGNOSIS — Z96642 Presence of left artificial hip joint: Secondary | ICD-10-CM | POA: Diagnosis not present

## 2016-04-19 DIAGNOSIS — Z96652 Presence of left artificial knee joint: Secondary | ICD-10-CM | POA: Diagnosis present

## 2016-04-19 DIAGNOSIS — Z880 Allergy status to penicillin: Secondary | ICD-10-CM | POA: Diagnosis not present

## 2016-04-19 DIAGNOSIS — R262 Difficulty in walking, not elsewhere classified: Secondary | ICD-10-CM

## 2016-04-19 HISTORY — PX: TOTAL HIP ARTHROPLASTY: SHX124

## 2016-04-19 LAB — GLUCOSE, CAPILLARY
GLUCOSE-CAPILLARY: 92 mg/dL (ref 65–99)
GLUCOSE-CAPILLARY: 190 mg/dL — AB (ref 65–99)
Glucose-Capillary: 126 mg/dL — ABNORMAL HIGH (ref 65–99)
Glucose-Capillary: 105 mg/dL — ABNORMAL HIGH (ref 65–99)

## 2016-04-19 SURGERY — ARTHROPLASTY, HIP, TOTAL, ANTERIOR APPROACH
Anesthesia: Spinal | Laterality: Left

## 2016-04-19 MED ORDER — ACETAMINOPHEN 650 MG RE SUPP: 650.0000 mg | Freq: Four times a day (QID) | RECTAL | Status: DC | PRN

## 2016-04-19 MED ORDER — INSULIN ASPART 100 UNIT/ML ~~LOC~~ SOLN
0.0000 [IU] | Freq: Three times a day (TID) | SUBCUTANEOUS | Status: DC
  Administered 2016-04-20: 3 [IU] via SUBCUTANEOUS
  Administered 2016-04-20: 11 [IU] via SUBCUTANEOUS
  Administered 2016-04-20: 3 [IU] via SUBCUTANEOUS
  Administered 2016-04-21: 2 [IU] via SUBCUTANEOUS
  Administered 2016-04-21: 3 [IU] via SUBCUTANEOUS

## 2016-04-19 MED ORDER — SENNOSIDES-DOCUSATE SODIUM 8.6-50 MG PO TABS: 1.0000 | ORAL_TABLET | Freq: Every evening | ORAL | 1 refills | Status: DC | PRN

## 2016-04-19 MED ORDER — ONDANSETRON HCL 4 MG/2ML IJ SOLN
INTRAMUSCULAR | Status: AC
  Filled 2016-04-19: qty 2

## 2016-04-19 MED ORDER — METHOCARBAMOL 500 MG PO TABS: 500.0000 mg | ORAL_TABLET | Freq: Four times a day (QID) | ORAL | 2 refills | Status: DC | PRN

## 2016-04-19 MED ORDER — CLINDAMYCIN PHOSPHATE 900 MG/50ML IV SOLN
900.0000 mg | INTRAVENOUS | Status: AC
  Administered 2016-04-19: 900 mg via INTRAVENOUS
  Filled 2016-04-19: qty 50

## 2016-04-19 MED ORDER — ONDANSETRON HCL 4 MG PO TABS: 4.0000 mg | ORAL_TABLET | Freq: Three times a day (TID) | ORAL | 0 refills | Status: DC | PRN

## 2016-04-19 MED ORDER — INSULIN GLARGINE 100 UNIT/ML ~~LOC~~ SOLN
10.0000 [IU] | Freq: Every day | SUBCUTANEOUS | Status: DC
  Administered 2016-04-20 – 2016-04-21 (×2): 10 [IU] via SUBCUTANEOUS
  Filled 2016-04-19 (×3): qty 0.1

## 2016-04-19 MED ORDER — DEXAMETHASONE SODIUM PHOSPHATE 10 MG/ML IJ SOLN
10.0000 mg | Freq: Once | INTRAMUSCULAR | Status: AC
  Administered 2016-04-20: 10 mg via INTRAVENOUS
  Filled 2016-04-19: qty 1

## 2016-04-19 MED ORDER — MAGNESIUM CITRATE PO SOLN: 1.0000 | Freq: Once | ORAL | Status: DC | PRN

## 2016-04-19 MED ORDER — BUPIVACAINE IN DEXTROSE 0.75-8.25 % IT SOLN
INTRATHECAL | Status: DC | PRN
Start: 2016-04-19 — End: 2016-04-19
  Administered 2016-04-19: 1.8 mL via INTRATHECAL

## 2016-04-19 MED ORDER — MORPHINE SULFATE (PF) 2 MG/ML IV SOLN
1.0000 mg | INTRAVENOUS | Status: DC | PRN
  Administered 2016-04-19: 1 mg via INTRAVENOUS
  Filled 2016-04-19: qty 1

## 2016-04-19 MED ORDER — PROMETHAZINE HCL 25 MG PO TABS: 25.0000 mg | ORAL_TABLET | Freq: Four times a day (QID) | ORAL | 1 refills | Status: DC | PRN

## 2016-04-19 MED ORDER — LINAGLIPTIN 5 MG PO TABS
5.0000 mg | ORAL_TABLET | Freq: Every day | ORAL | Status: DC
  Administered 2016-04-20 – 2016-04-21 (×2): 5 mg via ORAL
  Filled 2016-04-19 (×2): qty 1

## 2016-04-19 MED ORDER — CHLORHEXIDINE GLUCONATE 4 % EX LIQD: 60.0000 mL | Freq: Once | CUTANEOUS | Status: DC

## 2016-04-19 MED ORDER — ALUM & MAG HYDROXIDE-SIMETH 200-200-20 MG/5ML PO SUSP
30.0000 mL | ORAL | Status: DC | PRN
Start: 2016-04-19 — End: 2016-04-21

## 2016-04-19 MED ORDER — OXYCODONE HCL 5 MG/5ML PO SOLN: 5.0000 mg | Freq: Once | ORAL | Status: DC | PRN

## 2016-04-19 MED ORDER — SODIUM CHLORIDE 0.9 % IR SOLN
Status: DC | PRN
  Administered 2016-04-19: 1000 mL
  Administered 2016-04-19: 3000 mL

## 2016-04-19 MED ORDER — DIPHENHYDRAMINE HCL 12.5 MG/5ML PO ELIX: 25.0000 mg | ORAL_SOLUTION | ORAL | Status: DC | PRN

## 2016-04-19 MED ORDER — MINERAL OIL LIGHT 100 % EX OIL
TOPICAL_OIL | CUTANEOUS | Status: AC
  Filled 2016-04-19: qty 25

## 2016-04-19 MED ORDER — HYDROMORPHONE HCL 2 MG PO TABS: 2.0000 mg | ORAL_TABLET | ORAL | Status: DC | PRN

## 2016-04-19 MED ORDER — MENTHOL 3 MG MT LOZG: 1.0000 | LOZENGE | OROMUCOSAL | Status: DC | PRN

## 2016-04-19 MED ORDER — ASPIRIN EC 325 MG PO TBEC
325.0000 mg | DELAYED_RELEASE_TABLET | Freq: Two times a day (BID) | ORAL | Status: DC
  Administered 2016-04-19 – 2016-04-21 (×4): 325 mg via ORAL
  Filled 2016-04-19 (×3): qty 1

## 2016-04-19 MED ORDER — PRAVASTATIN SODIUM 20 MG PO TABS
10.0000 mg | ORAL_TABLET | Freq: Every day | ORAL | Status: DC
  Administered 2016-04-19 – 2016-04-20 (×2): 10 mg via ORAL
  Filled 2016-04-19 (×2): qty 1

## 2016-04-19 MED ORDER — LACTATED RINGERS IV SOLN
INTRAVENOUS | Status: DC | PRN
  Administered 2016-04-19 (×2): via INTRAVENOUS

## 2016-04-19 MED ORDER — 0.9 % SODIUM CHLORIDE (POUR BTL) OPTIME
TOPICAL | Status: DC | PRN
  Administered 2016-04-19: 1000 mL

## 2016-04-19 MED ORDER — FENTANYL CITRATE (PF) 100 MCG/2ML IJ SOLN
INTRAMUSCULAR | Status: AC
  Filled 2016-04-19: qty 2

## 2016-04-19 MED ORDER — ALLOPURINOL 100 MG PO TABS
100.0000 mg | ORAL_TABLET | Freq: Every day | ORAL | Status: DC
  Administered 2016-04-20 – 2016-04-21 (×2): 100 mg via ORAL
  Filled 2016-04-19 (×2): qty 1

## 2016-04-19 MED ORDER — PROPOFOL 10 MG/ML IV BOLUS
INTRAVENOUS | Status: AC
  Filled 2016-04-19: qty 20

## 2016-04-19 MED ORDER — SORBITOL 70 % SOLN
30.0000 mL | Freq: Every day | Status: DC | PRN
  Filled 2016-04-19: qty 30

## 2016-04-19 MED ORDER — ACETAMINOPHEN 500 MG PO TABS
1000.0000 mg | ORAL_TABLET | Freq: Four times a day (QID) | ORAL | Status: AC
  Administered 2016-04-19 – 2016-04-20 (×4): 1000 mg via ORAL
  Filled 2016-04-19 (×4): qty 2

## 2016-04-19 MED ORDER — FENTANYL CITRATE (PF) 100 MCG/2ML IJ SOLN
25.0000 ug | INTRAMUSCULAR | Status: DC | PRN
Start: 2016-04-19 — End: 2016-04-19

## 2016-04-19 MED ORDER — PHENYLEPHRINE HCL 10 MG/ML IJ SOLN
INTRAMUSCULAR | Status: DC | PRN
  Administered 2016-04-19: 25 ug/min via INTRAVENOUS

## 2016-04-19 MED ORDER — ASPIRIN EC 325 MG PO TBEC: 325.0000 mg | DELAYED_RELEASE_TABLET | Freq: Two times a day (BID) | ORAL | 0 refills | Status: DC

## 2016-04-19 MED ORDER — KETOROLAC TROMETHAMINE 15 MG/ML IJ SOLN
15.0000 mg | Freq: Four times a day (QID) | INTRAMUSCULAR | Status: AC
  Administered 2016-04-19 – 2016-04-20 (×4): 15 mg via INTRAVENOUS
  Filled 2016-04-19 (×4): qty 1

## 2016-04-19 MED ORDER — ONDANSETRON HCL 4 MG/2ML IJ SOLN: 4.0000 mg | Freq: Four times a day (QID) | INTRAMUSCULAR | Status: DC | PRN

## 2016-04-19 MED ORDER — ACETAMINOPHEN 325 MG PO TABS
650.0000 mg | ORAL_TABLET | Freq: Four times a day (QID) | ORAL | Status: DC | PRN
  Administered 2016-04-21: 650 mg via ORAL
  Filled 2016-04-19: qty 2

## 2016-04-19 MED ORDER — HYDROMORPHONE HCL 2 MG PO TABS: 2.0000 mg | ORAL_TABLET | ORAL | 0 refills | Status: DC | PRN

## 2016-04-19 MED ORDER — METOCLOPRAMIDE HCL 5 MG PO TABS: 5.0000 mg | ORAL_TABLET | Freq: Three times a day (TID) | ORAL | Status: DC | PRN

## 2016-04-19 MED ORDER — POLYETHYLENE GLYCOL 3350 17 G PO PACK: 17.0000 g | PACK | Freq: Every day | ORAL | Status: DC | PRN

## 2016-04-19 MED ORDER — CEFAZOLIN SODIUM-DEXTROSE 2-4 GM/100ML-% IV SOLN: 2.0000 g | Freq: Four times a day (QID) | INTRAVENOUS | Status: DC

## 2016-04-19 MED ORDER — LISINOPRIL 40 MG PO TABS
40.0000 mg | ORAL_TABLET | Freq: Every day | ORAL | Status: DC
  Administered 2016-04-19 – 2016-04-21 (×3): 40 mg via ORAL
  Filled 2016-04-19 (×3): qty 1

## 2016-04-19 MED ORDER — CLINDAMYCIN PHOSPHATE 900 MG/50ML IV SOLN
900.0000 mg | Freq: Three times a day (TID) | INTRAVENOUS | Status: AC
  Administered 2016-04-19 – 2016-04-20 (×2): 900 mg via INTRAVENOUS
  Filled 2016-04-19 (×2): qty 50

## 2016-04-19 MED ORDER — INSULIN ASPART 100 UNIT/ML ~~LOC~~ SOLN
0.0000 [IU] | Freq: Every day | SUBCUTANEOUS | Status: DC
  Administered 2016-04-20: 2 [IU] via SUBCUTANEOUS

## 2016-04-19 MED ORDER — LORATADINE 10 MG PO TABS: 10.0000 mg | ORAL_TABLET | Freq: Every day | ORAL | Status: DC | PRN

## 2016-04-19 MED ORDER — PHENOL 1.4 % MT LIQD: 1.0000 | OROMUCOSAL | Status: DC | PRN

## 2016-04-19 MED ORDER — METHOCARBAMOL 500 MG PO TABS
500.0000 mg | ORAL_TABLET | Freq: Four times a day (QID) | ORAL | Status: DC | PRN
  Administered 2016-04-20: 500 mg via ORAL
  Filled 2016-04-19: qty 1

## 2016-04-19 MED ORDER — METOCLOPRAMIDE HCL 5 MG/ML IJ SOLN: 5.0000 mg | Freq: Three times a day (TID) | INTRAMUSCULAR | Status: DC | PRN

## 2016-04-19 MED ORDER — EPHEDRINE SULFATE 50 MG/ML IJ SOLN
INTRAMUSCULAR | Status: DC | PRN
  Administered 2016-04-19: 5 mg via INTRAVENOUS
  Administered 2016-04-19: 2.5 mg via INTRAVENOUS

## 2016-04-19 MED ORDER — PROPOFOL 500 MG/50ML IV EMUL
INTRAVENOUS | Status: DC | PRN
  Administered 2016-04-19: 75 ug/kg/min via INTRAVENOUS

## 2016-04-19 MED ORDER — MELOXICAM 7.5 MG PO TABS
7.5000 mg | ORAL_TABLET | Freq: Two times a day (BID) | ORAL | Status: DC
  Administered 2016-04-21: 7.5 mg via ORAL
  Filled 2016-04-19: qty 1

## 2016-04-19 MED ORDER — ONDANSETRON HCL 4 MG PO TABS: 4.0000 mg | ORAL_TABLET | Freq: Four times a day (QID) | ORAL | Status: DC | PRN

## 2016-04-19 MED ORDER — OXYCODONE HCL 5 MG PO TABS: 5.0000 mg | ORAL_TABLET | Freq: Once | ORAL | Status: DC | PRN

## 2016-04-19 MED ORDER — METHOCARBAMOL 1000 MG/10ML IJ SOLN
500.0000 mg | Freq: Four times a day (QID) | INTRAVENOUS | Status: DC | PRN
  Filled 2016-04-19: qty 5

## 2016-04-19 MED ORDER — ONDANSETRON HCL 4 MG/2ML IJ SOLN
INTRAMUSCULAR | Status: DC | PRN
  Administered 2016-04-19: 4 mg via INTRAVENOUS

## 2016-04-19 MED ORDER — FENTANYL CITRATE (PF) 100 MCG/2ML IJ SOLN
INTRAMUSCULAR | Status: DC | PRN
  Administered 2016-04-19 (×2): 25 ug via INTRAVENOUS

## 2016-04-19 MED ORDER — SODIUM CHLORIDE 0.9 % IV SOLN
INTRAVENOUS | Status: DC
  Administered 2016-04-19: 17:00:00 via INTRAVENOUS

## 2016-04-19 SURGICAL SUPPLY — 47 items
BAG DECANTER FOR FLEXI CONT (MISCELLANEOUS) IMPLANT
CAPT HIP TOTAL 2 ×3 IMPLANT
CELLS DAT CNTRL 66122 CELL SVR (MISCELLANEOUS) ×1 IMPLANT
COVER SURGICAL LIGHT HANDLE (MISCELLANEOUS) ×3 IMPLANT
DRAPE C-ARM 42X72 X-RAY (DRAPES) ×3 IMPLANT
DRAPE STERI IOBAN 125X83 (DRAPES) ×3 IMPLANT
DRAPE U-SHAPE 47X51 STRL (DRAPES) ×9 IMPLANT
DRSG AQUACEL AG ADV 3.5X10 (GAUZE/BANDAGES/DRESSINGS) ×3 IMPLANT
DURAPREP 26ML APPLICATOR (WOUND CARE) ×3 IMPLANT
ELECT BLADE 4.0 EZ CLEAN MEGAD (MISCELLANEOUS) ×3
ELECT REM PT RETURN 9FT ADLT (ELECTROSURGICAL) ×3
ELECTRODE BLDE 4.0 EZ CLN MEGD (MISCELLANEOUS) ×1 IMPLANT
ELECTRODE REM PT RTRN 9FT ADLT (ELECTROSURGICAL) ×1 IMPLANT
GLOVE SKINSENSE NS SZ7.5 (GLOVE) ×2
GLOVE SKINSENSE STRL SZ7.5 (GLOVE) ×1 IMPLANT
GLOVE SURG SYN 7.5  E (GLOVE) ×4
GLOVE SURG SYN 7.5 E (GLOVE) ×2 IMPLANT
GOWN SRG XL XLNG 56XLVL 4 (GOWN DISPOSABLE) ×2 IMPLANT
GOWN STRL NON-REIN XL XLG LVL4 (GOWN DISPOSABLE) ×4
GOWN STRL REUS W/ TWL LRG LVL3 (GOWN DISPOSABLE) IMPLANT
GOWN STRL REUS W/TWL LRG LVL3 (GOWN DISPOSABLE)
HANDPIECE INTERPULSE COAX TIP (DISPOSABLE) ×2
HOOD PEEL AWAY FLYTE STAYCOOL (MISCELLANEOUS) ×6 IMPLANT
IV NS 1000ML (IV SOLUTION) ×2
IV NS 1000ML BAXH (IV SOLUTION) ×1 IMPLANT
IV NS IRRIG 3000ML ARTHROMATIC (IV SOLUTION) ×3 IMPLANT
KIT BASIN OR (CUSTOM PROCEDURE TRAY) ×3 IMPLANT
MARKER SKIN DUAL TIP RULER LAB (MISCELLANEOUS) ×3 IMPLANT
NEEDLE SPNL 18GX3.5 QUINCKE PK (NEEDLE) ×3 IMPLANT
PACK TOTAL JOINT (CUSTOM PROCEDURE TRAY) ×3 IMPLANT
PACK UNIVERSAL I (CUSTOM PROCEDURE TRAY) ×3 IMPLANT
RTRCTR WOUND ALEXIS 18CM MED (MISCELLANEOUS) ×3
SAW OSC TIP CART 19.5X105X1.3 (SAW) ×3 IMPLANT
SEALER BIPOLAR AQUA 6.0 (INSTRUMENTS) ×3 IMPLANT
SET HNDPC FAN SPRY TIP SCT (DISPOSABLE) ×1 IMPLANT
STAPLER VISISTAT 35W (STAPLE) IMPLANT
SUT ETHIBOND 2 V 37 (SUTURE) ×3 IMPLANT
SUT ETHIBOND NAB CT1 #1 30IN (SUTURE) ×9 IMPLANT
SUT VIC AB 1 CT1 27 (SUTURE) ×2
SUT VIC AB 1 CT1 27XBRD ANBCTR (SUTURE) ×1 IMPLANT
SUT VIC AB 2-0 CT1 27 (SUTURE) ×2
SUT VIC AB 2-0 CT1 TAPERPNT 27 (SUTURE) ×1 IMPLANT
SYR 20CC LL (SYRINGE) ×3 IMPLANT
SYR 50ML LL SCALE MARK (SYRINGE) IMPLANT
TOWEL OR 17X26 10 PK STRL BLUE (TOWEL DISPOSABLE) ×3 IMPLANT
TRAY CATH 16FR W/PLASTIC CATH (SET/KITS/TRAYS/PACK) ×3 IMPLANT
YANKAUER SUCT BULB TIP NO VENT (SUCTIONS) ×3 IMPLANT

## 2016-04-19 NOTE — Anesthesia Preprocedure Evaluation (Addendum)
Anesthesia Evaluation  Patient identified by MRN, date of birth, ID band Patient awake    Reviewed: Allergy & Precautions, H&P , NPO status , Patient's Chart, lab work & pertinent test results  History of Anesthesia Complications (+) PONV  Airway Mallampati: II   Neck ROM: full    Dental   Pulmonary shortness of breath, former smoker,    Pulmonary exam normal breath sounds clear to auscultation       Cardiovascular hypertension, Normal cardiovascular exam+ Valvular Problems/Murmurs AS  Rhythm:regular Rate:Normal  S/p AVR, tissue valve   Neuro/Psych PSYCHIATRIC DISORDERS Depression    GI/Hepatic   Endo/Other  diabetes, Type 2  Renal/GU      Musculoskeletal  (+) Arthritis ,   Abdominal   Peds  Hematology   Anesthesia Other Findings   Reproductive/Obstetrics                            Anesthesia Physical Anesthesia Plan  ASA: III  Anesthesia Plan: MAC and Spinal   Post-op Pain Management:    Induction: Intravenous  Airway Management Planned: Simple Face Mask  Additional Equipment:   Intra-op Plan:   Post-operative Plan:   Informed Consent: I have reviewed the patients History and Physical, chart, labs and discussed the procedure including the risks, benefits and alternatives for the proposed anesthesia with the patient or authorized representative who has indicated his/her understanding and acceptance.     Plan Discussed with: CRNA and Surgeon  Anesthesia Plan Comments:        Anesthesia Quick Evaluation

## 2016-04-19 NOTE — Transfer of Care (Signed)
Immediate Anesthesia Transfer of Care Note  Patient: Linda Lawson  Procedure(s) Performed: Procedure(s): LEFT TOTAL HIP ARTHROPLASTY ANTERIOR APPROACH (Left)  Patient Location: PACU  Anesthesia Type:MAC and Spinal  Level of Consciousness: awake, alert  and oriented  Airway & Oxygen Therapy: Patient Spontanous Breathing and Patient connected to face mask oxygen  Post-op Assessment: Report given to RN and Post -op Vital signs reviewed and stable  Post vital signs: Reviewed and stable  Last Vitals:  Vitals:   04/19/16 1016 04/19/16 1505  BP: (!) 180/82 110/75  Pulse: 80 73  Resp: 18 17  Temp: 36.9 C 36.1 C    Last Pain:  Vitals:   04/19/16 1016  TempSrc: Oral      Patients Stated Pain Goal: 3 (04/19/16 1032)  Complications: No apparent anesthesia complications

## 2016-04-19 NOTE — Anesthesia Procedure Notes (Signed)
Procedure Name: MAC Date/Time: 04/19/2016 12:47 PM Performed by: Jed LimerickHARDER, Shandelle Borrelli S Pre-anesthesia Checklist: Patient identified, Emergency Drugs available, Suction available, Patient being monitored and Timeout performed Oxygen Delivery Method: Simple face mask Dental Injury: Teeth and Oropharynx as per pre-operative assessment

## 2016-04-19 NOTE — Anesthesia Postprocedure Evaluation (Signed)
Anesthesia Post Note  Patient: Linda Lawson  Procedure(s) Performed: Procedure(s) (LRB): LEFT TOTAL HIP ARTHROPLASTY ANTERIOR APPROACH (Left)  Patient location during evaluation: PACU Anesthesia Type: Spinal Level of consciousness: oriented and awake and alert Pain management: pain level controlled Vital Signs Assessment: post-procedure vital signs reviewed and stable Respiratory status: spontaneous breathing, respiratory function stable and patient connected to nasal cannula oxygen Cardiovascular status: blood pressure returned to baseline and stable Postop Assessment: no headache and no backache Anesthetic complications: no    Last Vitals:  Vitals:   04/19/16 1530 04/19/16 1545  BP: 125/71   Pulse: 66 62  Resp: 13 19  Temp:      Last Pain:  Vitals:   04/19/16 1530  TempSrc:   PainSc: 0-No pain                 Mikenna Bunkley DAVID

## 2016-04-19 NOTE — H&P (Signed)
PREOPERATIVE H&P  Chief Complaint: left hip degenerative joint disease  HPI: Linda Lawson is a 80 y.o. female who presents for surgical treatment of left hip degenerative joint disease.  She denies any changes in medical history.  Past Medical History:  Diagnosis Date  . Aortic stenosis    s/p AVR (tissue) 2010  . Arthritis   . Complication of anesthesia   . Depression    situatinal  . Diabetes mellitus without complication (HCC)    Type II  . Dyspnea    with exertion  . Gout   . Hyperlipidemia   . Hypertension   . Pneumonia 2010  . PONV (postoperative nausea and vomiting)    Past Surgical History:  Procedure Laterality Date  . COLONOSCOPY    . HIP ARTHROPLASTY Right 2002  . JOINT REPLACEMENT    . KNEE ARTHROPLASTY Left 2012  . pericardial tissue heart valve  2010   AVR   Social History   Social History  . Marital status: Single    Spouse name: N/A  . Number of children: N/A  . Years of education: N/A   Social History Main Topics  . Smoking status: Former Smoker    Years: 20.00    Quit date: 06/12/2008  . Smokeless tobacco: Never Used  . Alcohol use No  . Drug use: No  . Sexual activity: Not on file   Other Topics Concern  . Not on file   Social History Narrative  . No narrative on file   No family history on file. Allergies  Allergen Reactions  . Penicillins Swelling and Rash    Has patient had a PCN reaction causing immediate rash, facial/tongue/throat swelling, SOB or lightheadedness with hypotension: Yes Has patient had a PCN reaction causing severe rash involving mucus membranes or skin necrosis: No Has patient had a PCN reaction that required hospitalization: No Has patient had a PCN reaction occurring within the last 10 years: No If all of the above answers are "NO", then may proceed with Cephalosporin use.   . Codeine Nausea And Vomiting   Prior to Admission medications   Medication Sig Start Date End Date Taking? Authorizing  Provider  allopurinol (ZYLOPRIM) 100 MG tablet Take 100 mg by mouth daily.  12/27/15  Yes Historical Provider, MD  aspirin 81 MG tablet Take 81 mg by mouth daily.   Yes Historical Provider, MD  LANTUS SOLOSTAR 100 UNIT/ML Solostar Pen Inject 10 Units into the skin daily.  12/22/15  Yes Historical Provider, MD  lisinopril (PRINIVIL,ZESTRIL) 40 MG tablet Take 40 mg by mouth daily.   Yes Historical Provider, MD  loratadine (CLARITIN) 10 MG tablet Take 10 mg by mouth daily as needed for allergies or rhinitis.   Yes Historical Provider, MD  meloxicam (MOBIC) 7.5 MG tablet Take 7.5 mg by mouth 2 (two) times daily after a meal.  12/27/15  Yes Historical Provider, MD  pravastatin (PRAVACHOL) 10 MG tablet Take 10 mg by mouth at bedtime.  12/18/15  Yes Historical Provider, MD  TRADJENTA 5 MG TABS tablet Take 5 mg by mouth daily with breakfast.  12/09/15  Yes Historical Provider, MD  ACCU-CHEK AVIVA PLUS test strip  11/09/15   Historical Provider, MD  ACCU-CHEK SOFTCLIX LANCETS lancets  11/09/15   Historical Provider, MD  B-D UF III MINI PEN NEEDLES 31G X 5 MM MISC  12/29/15   Historical Provider, MD     Positive ROS: All other systems have been reviewed and were otherwise  negative with the exception of those mentioned in the HPI and as above.  Physical Exam: General: Alert, no acute distress Cardiovascular: No pedal edema Respiratory: No cyanosis, no use of accessory musculature GI: abdomen soft Skin: No lesions in the area of chief complaint Neurologic: Sensation intact distally Psychiatric: Patient is competent for consent with normal mood and affect Lymphatic: no lymphedema  MUSCULOSKELETAL: exam stable  Assessment: left hip degenerative joint disease  Plan: Plan for Procedure(s): LEFT TOTAL HIP ARTHROPLASTY ANTERIOR APPROACH  The risks benefits and alternatives were discussed with the patient including but not limited to the risks of nonoperative treatment, versus surgical intervention including  infection, bleeding, nerve injury,  blood clots, cardiopulmonary complications, morbidity, mortality, among others, and they were willing to proceed.   Glee ArvinMichael Emerie Vanderkolk, MD   04/19/2016 9:55 AM

## 2016-04-19 NOTE — Discharge Instructions (Signed)

## 2016-04-19 NOTE — Op Note (Signed)
LEFT TOTAL HIP ARTHROPLASTY ANTERIOR APPROACH  Procedure Note Neldon Mcvelyn D Brenner   [id number]  Pre-op Diagnosis: left hip degenerative joint disease     Post-op Diagnosis: same   Operative Procedures  1. Total hip replacement; Left hip; uncemented [id number]   Personnel  Surgeon(s): Tarry KosNaiping M Xu, MD   Anesthesia: spinal  Prosthesis: Depuy Acetabulum: Pinnacle 52 mm Femur: Corail KA 12 Head: 36 size: -2 Liner: +4 Bearing Type: metal on poly  Date of Service: 04/19/2016  Total Hip Arthroplasty (Anterior Approach) Op Note:  After informed consent was obtained and the operative extremity marked in the holding area, the patient was brought back to the operating room and placed supine on the HANA table. Next, the operative extremity was prepped and draped in normal sterile fashion. Surgical timeout occurred verifying patient identification, surgical site, surgical procedure and administration of antibiotics.  A modified anterior Smith-Peterson approach to the hip was performed, using the interval between tensor fascia lata and sartorius.  Dissection was carried bluntly down onto the anterior hip capsule. The lateral femoral circumflex vessels were identified and coagulated. A capsulotomy was performed and the capsular flaps tagged for later repair.  Fluoroscopy was utilized to prepare for the femoral neck cut. The neck osteotomy was performed. The femoral head was removed, the acetabular rim was cleared of soft tissue and attention was turned to reaming the acetabulum.  Sequential reaming was performed under fluoroscopic guidance. We reamed to a size 51 mm, and then impacted the acetabular shell. The liner was then placed after irrigation and attention turned to the femur.  After placing the femoral hook, the leg was taken to externally rotated, extended and adducted position taking care to perform soft tissue releases to allow for adequate mobilization of the femur. Soft tissue was cleared from  the shoulder of the greater trochanter and the hook elevator used to improve exposure of the proximal femur. Sequential broaching performed up to a size 12. Trial neck and head were placed. The leg was brought back up to neutral and the construct reduced. The position and sizing of components, offset and leg lengths were checked using fluoroscopy. Stability of the  construct was checked in extension and external rotation without any subluxation or impingement of prosthesis. We dislocated the prosthesis, dropped the leg back into position, removed trial components, and irrigated copiously. The final stem and head was then placed, the leg brought back up, the system reduced and fluoroscopy used to verify positioning.  We irrigated, obtained hemostasis and closed the capsule using #2 ethibond suture.  Dilute betadyne solution was used. The fascia was closed with #1 vicryl plus, the deep fat layer was closed with 0 vicryl, the subcutaneous layers closed with 2.0 Vicryl Plus and the skin closed with 3.0 monocryl and steri strips. A sterile dressing was applied. The patient was awakened in the operating room and taken to recovery in stable condition.  All sponge, needle, and instrument counts were correct at the end of the case.   Position: supine  Complications: none.  Time Out: performed   Drains/Packing: none  Estimated blood loss: 100 cc  Returned to Recovery Room: in good condition.   Antibiotics: yes   Mechanical VTE (DVT) Prophylaxis: sequential compression devices, TED thigh-high  Chemical VTE (DVT) Prophylaxis: aspirin   Fluid Replacement: see anesthesia record  Specimens Removed: 1 to pathology   Sponge and Instrument Count Correct? yes   PACU: portable radiograph - low AP   Admission: inpatient status  Plan/RTC:  Return in 2 weeks for staple removal. Weight Bearing/Load Lower Extremity: full  Hip precautions: none Suture Removal: 10-14 days  Betadine to incision twice daily once  dressing is removed on POD#7  N. Glee ArvinMichael Xu, MD [facility] [phone] 2:43 PM      Implant Name Type Inv. Item Serial No. Manufacturer Lot No. LRB No. Used  PIN SECTOR W/GRIP ACE CUP 52MM - [id number] Hips PIN SECTOR W/GRIP ACE CUP 52MM  DEPUY SYNTHES [id number] Left 1  LINER NEUTRAL 52X36MM PLUS 4 - [id number] Liner LINER NEUTRAL 52X36MM PLUS 4  DEPUY SYNTHES [id number] Left 1  STEM CORAIL KA12 - [id number] Stem STEM CORAIL KA12  DEPUY SYNTHES [id number] Left 1  SROM M HEAD 36MM 2 - [id number] Hips SROM M HEAD 36MM 2   DEPUY SYNTHES [id number] Left 1

## 2016-04-19 NOTE — Anesthesia Procedure Notes (Signed)
Spinal  Patient location during procedure: OR Start time: 04/19/2016 12:56 PM End time: 04/19/2016 1:00 PM Staffing Anesthesiologist: HODIERNE, ADAM Performed: anesthesiologist  Preanesthetic Checklist Completed: patient identified, site marked, surgical consent, pre-op evaluation, timeout performed, IV checked, risks and benefits discussed and monitors and equipment checked Spinal Block Patient position: sitting Prep: Betadine and site prepped and draped Patient monitoring: heart rate, cardiac monitor, continuous pulse ox and blood pressure Approach: midline Location: L3-4 Injection technique: single-shot Needle Needle type: Pencan  Needle gauge: 24 G Needle length: 9 cm Assessment Sensory level: T8 Additional Notes Pt tolerated the procedure well.

## 2016-04-19 NOTE — Addendum Note (Signed)
Addendum  created 04/19/16 1719 by Jed LimerickBlaire S Jefte Carithers, CRNA   Anesthesia Intra Flowsheets edited

## 2016-04-20 ENCOUNTER — Encounter (HOSPITAL_COMMUNITY): Payer: Self-pay | Admitting: General Practice

## 2016-04-20 LAB — BASIC METABOLIC PANEL
ANION GAP: 8 (ref 5–15)
BUN: 12 mg/dL (ref 6–20)
CHLORIDE: 107 mmol/L (ref 101–111)
CO2: 23 mmol/L (ref 22–32)
Calcium: 8.7 mg/dL — ABNORMAL LOW (ref 8.9–10.3)
Creatinine, Ser: 1.03 mg/dL — ABNORMAL HIGH (ref 0.44–1.00)
GFR calc Af Amer: 59 mL/min — ABNORMAL LOW (ref >60)
GFR, EST NON AFRICAN AMERICAN: 51 mL/min — AB (ref >60)
GLUCOSE: 168 mg/dL — AB (ref 65–99)
POTASSIUM: 4.4 mmol/L (ref 3.5–5.1)
Sodium: 138 mmol/L (ref 135–145)

## 2016-04-20 LAB — GLUCOSE, CAPILLARY
GLUCOSE-CAPILLARY: 308 mg/dL — AB (ref 65–99)
GLUCOSE-CAPILLARY: 215 mg/dL — AB (ref 65–99)
Glucose-Capillary: 159 mg/dL — ABNORMAL HIGH (ref 65–99)
Glucose-Capillary: 169 mg/dL — ABNORMAL HIGH (ref 65–99)

## 2016-04-20 LAB — CBC
HEMATOCRIT: 29 % — AB (ref 36.0–46.0)
HEMOGLOBIN: 9.3 g/dL — AB (ref 12.0–15.0)
MCH: 24.3 pg — AB (ref 26.0–34.0)
MCHC: 32.1 g/dL (ref 30.0–36.0)
MCV: 75.7 fL — AB (ref 78.0–100.0)
PLATELETS: 246 10*3/uL (ref 150–400)
RBC: 3.83 MIL/uL — AB (ref 3.87–5.11)
RDW: 14.2 % (ref 11.5–15.5)
WBC: 13.6 10*3/uL — AB (ref 4.0–10.5)

## 2016-04-20 NOTE — Progress Notes (Signed)
Physical Therapy Treatment Patient Details Name: Linda Lawson MRN: [id number] DOB: 04/03/1936 Today's Date: 04/20/2016    History of Present Illness 80 yo female admitted on 04/19/16 for elective left THA direct anterior. PMH significant for right THA 2002, R TKA 2015, DM2, HTN     PT Comments    Pt demonstrates improved tolerance for gait this PM session. Performed additional LE strengthening exercises and advised pt to continue these exercises at least 3x daily. Pt reports adherence to completing AM exercises through the morning. Pt continues to benefit from SNF placement upon DC in order to improve LE strength and endurance before DC home alone.    Follow Up Recommendations  SNF     Equipment Recommendations  Other (comment);Rolling walker with 5" wheels;3in1 (PT) (defer to next venue of care)    Recommendations for Other Services       Precautions / Restrictions Precautions Precautions: None Precaution Comments: Direct Anterior Restrictions Weight Bearing Restrictions: Yes LLE Weight Bearing: Weight bearing as tolerated    Mobility  Bed Mobility Overal bed mobility: Needs Assistance Bed Mobility: Supine to Sit     Supine to sit: Min guard;HOB elevated     General bed mobility comments: Up in chair  Transfers Overall transfer level: Needs assistance Equipment used: Rolling walker (2 wheeled) Transfers: Sit to/from Stand Sit to Stand: Min assist         General transfer comment: Min A for safety from low recliner  Ambulation/Gait Ambulation/Gait assistance: Min assist Ambulation Distance (Feet): 50 Feet Assistive device: Rolling walker (2 wheeled) Gait Pattern/deviations: Step-through pattern;Decreased step length - right;Decreased stance time - left;Antalgic Gait velocity: decreased Gait velocity interpretation: Below normal speed for age/gender General Gait Details: Mild antalgic gait with cues for heel strike on LLE   Stairs             Wheelchair Mobility    Modified Rankin (Stroke Patients Only)       Balance Overall balance assessment: Needs assistance Sitting-balance support: No upper extremity supported;Feet supported Sitting balance-Leahy Scale: Good Sitting balance - Comments: EOB no back support   Standing balance support: Bilateral upper extremity supported Standing balance-Leahy Scale: Poor Standing balance comment: Relies on RW for balance in standing and moves posteriorly without UE support on RW requiring assistance of PT to stabilize                    Cognition Arousal/Alertness: Awake/alert Behavior During Therapy: WFL for tasks assessed/performed Overall Cognitive Status: Within Functional Limits for tasks assessed                      Exercises Total Joint Exercises Ankle Circles/Pumps: AROM;Both;20 reps;Supine Quad Sets: AROM;Left;10 reps;Supine Short Arc Quad: AROM;Left;10 reps;Supine Heel Slides: AROM;Left;10 reps;Supine Hip ABduction/ADduction: AROM;Left;10 reps;Supine Long Arc Quad: AROM;Left;10 reps;Seated    General Comments        Pertinent Vitals/Pain Pain Assessment: 0-10 Pain Score: 5  Pain Location: left hip Pain Descriptors / Indicators: Burning Pain Intervention(s): Monitored during session;Premedicated before session;Repositioned;Ice applied    Home Living                      Prior Function            PT Goals (current goals can now be found in the care plan section) Acute Rehab PT Goals Patient Stated Goal: rehab then home PT Goal Formulation: With patient Time For Goal Achievement: 04/27/16 Potential to Achieve  Goals: Good Progress towards PT goals: Progressing toward goals    Frequency    7X/week      PT Plan Current plan remains appropriate    Co-evaluation             End of Session Equipment Utilized During Treatment: Gait belt Activity Tolerance: Patient tolerated treatment well Patient left: in  chair;with call bell/phone within reach     Time: 1154-1209 PT Time Calculation (min) (ACUTE ONLY): 15 min  Charges:  $Gait Training: 8-22 mins                    G Codes:      Colin BroachSabra M. Roselinda Bahena PT, DPT  [phone]  04/20/2016, 12:41 PM

## 2016-04-20 NOTE — Consult Note (Signed)
[facility] CM Primary Care Navigator  04/20/2016  Linda Lawson 12-Sep-1935 [id number]   Met with patient at the bedside to identify possible discharge needs. Patient reports worsening pain to left hip that failed non-surgical intervention had led to this admission/surgery.  Patient endorses Dr. Rachell Cipro with Rachell Cipro MD office as the primary care provider.    Patient shared using Linda Lawson) (with seldom use of Linda Lawson) to obtain medications without difficulty so far.   Patient manages her own medications at home using "pill box"system.   She reports using [employer] bus for transportation prior to admission. Patient states that her friend Linda Lawson) will be able to provide transportation to her doctors' appointments.  She was reminded of [employer] transportation benefits and states that she is aware and had been using it.  Patient lives alone and independent with self care prior to admission. Her friend Linda Lawson) can be able to assist with her care when needed. She also mentioned church members that can help with care needs at home.   Discharge plan is short term rehabilitation at a skilled nursing facility [facility]).  Patient expressed understanding to call primary care provider's office when she returns home, for a post discharge follow-up appointment within a week or sooner if needed. Patient letter provided as a reminder.  Patient deniesfurther needs, concerns or need for disease management at this time. [facility] care management contact information provided if future needs arise.  For additional questions please contact:  Linda Lawson, BSN, RN-BC [facility] PRIMARY CARE Navigator Cell: [phone]

## 2016-04-20 NOTE — Evaluation (Signed)
Physical Therapy Evaluation Patient Details Name: Linda Lawson MRN: [id number] DOB: 12-Apr-1936 Today's Date: 04/20/2016   History of Present Illness  80 yo female admitted on 04/19/16 for elective left THA direct anterior. PMH significant for right THA 2002, R TKA 2015, DM2, HTN   Clinical Impression  Pt is POD 1 and presents with above diagnosis. Pt is moving well with therapy this AM with minimal pain with mobility. Prior to admission, pt was independent and living alone in a two story home using [employer] for transportation for running errands to to MD appointments. Pt will not have any help upon returning home and has to negotiate 14 steps to get upstairs to bedroom and bathrooms. Pt will benefit from SNF placement for short term rehab in order to improve strength and mobility before returning home. Pt will benefit from continuing to be seen acutely to address below deficits.     Follow Up Recommendations SNF    Equipment Recommendations  Other (comment);Rolling walker with 5" wheels;3in1 (PT) (defer to next venue of care)    Recommendations for Other Services       Precautions / Restrictions Precautions Precautions: None Precaution Comments: Direct Anterior Restrictions Weight Bearing Restrictions: Yes LLE Weight Bearing: Weight bearing as tolerated      Mobility  Bed Mobility Overal bed mobility: Needs Assistance Bed Mobility: Supine to Sit     Supine to sit: Min guard;HOB elevated     General bed mobility comments: Min guard for safety to EOB  Transfers Overall transfer level: Needs assistance Equipment used: Rolling walker (2 wheeled) Transfers: Sit to/from Stand Sit to Stand: Min assist         General transfer comment: Min A for safety from EOB to RW with assistance at the trunk  Ambulation/Gait Ambulation/Gait assistance: Min assist Ambulation Distance (Feet): 40 Feet Assistive device: Rolling walker (2 wheeled) Gait Pattern/deviations: Step-through  pattern;Decreased step length - right;Decreased stance time - left;Antalgic Gait velocity: decreased Gait velocity interpretation: Below normal speed for age/gender General Gait Details: Mild antalgic gait with cues for heel strike on LLE  Stairs            Wheelchair Mobility    Modified Rankin (Stroke Patients Only)       Balance Overall balance assessment: Needs assistance Sitting-balance support: No upper extremity supported;Feet supported Sitting balance-Leahy Scale: Good Sitting balance - Comments: EOB no back support   Standing balance support: Bilateral upper extremity supported Standing balance-Leahy Scale: Poor Standing balance comment: Relies on RW for balance in standing and moves posteriorly without UE support on RW requiring assistance of PT to stabilize                             Pertinent Vitals/Pain Pain Assessment: 0-10 Pain Score: 3  Pain Location: left hip Pain Descriptors / Indicators: Burning Pain Intervention(s): Monitored during session;Repositioned    Home Living Family/patient expects to be discharged to:: Skilled nursing facility Living Arrangements: Alone               Additional Comments: Won't have any help upon discharge    Prior Function Level of Independence: Independent with assistive device(s)         Comments: Used cane PRN for longer distance walking. Used [employer] for transportation     Hand Dominance   Dominant Hand: Right    Extremity/Trunk Assessment   Upper Extremity Assessment: Defer to OT evaluation  Lower Extremity Assessment: LLE deficits/detail   LLE Deficits / Details: pt with normal post op pain and weakness. At least 3/5 ankle, 2/5 knee and hip per gross functional assessment     Communication   Communication: No difficulties  Cognition Arousal/Alertness: Awake/alert Behavior During Therapy: WFL for tasks assessed/performed Overall Cognitive Status: Within Functional  Limits for tasks assessed                      General Comments      Exercises Total Joint Exercises Ankle Circles/Pumps: AROM;Both;20 reps;Supine Quad Sets: AROM;Left;10 reps;Supine Heel Slides: AROM;Left;10 reps;Supine   Assessment/Plan    PT Assessment Patient needs continued PT services  PT Problem List Decreased strength;Decreased range of motion;Decreased activity tolerance;Decreased balance;Decreased mobility;Decreased knowledge of use of DME;Pain          PT Treatment Interventions DME instruction;Gait training;Functional mobility training;Therapeutic activities;Therapeutic exercise;Balance training;Patient/family education    PT Goals (Current goals can be found in the Care Plan section)  Acute Rehab PT Goals Patient Stated Goal: rehab then home PT Goal Formulation: With patient Time For Goal Achievement: 04/27/16 Potential to Achieve Goals: Good    Frequency 7X/week   Barriers to discharge        Co-evaluation               End of Session Equipment Utilized During Treatment: Gait belt Activity Tolerance: Patient tolerated treatment well Patient left: in chair;with call bell/phone within reach Nurse Communication: Mobility status         Time: 0829-0900 PT Time Calculation (min) (ACUTE ONLY): 31 min   Charges:   PT Evaluation $PT Eval Low Complexity: 1 Procedure PT Treatments $Gait Training: 8-22 mins   PT G Codes:        Colin BroachSabra M. Preciosa Bundrick PT, DPT  [phone]  04/20/2016, 9:12 AM

## 2016-04-20 NOTE — Care Management Note (Signed)
Case Management Note  Patient Details  Name: Linda Lawson MRN: [id number] Date of Birth: 09-Oct-1935  Subjective/Objective: 80 yr old female s/p left total hip arthroplasty.                   Action/Plan: Patient  Will need short term rehab at SNF. Social worker is arranging.   Expected Discharge Date:   129/17               Expected Discharge Plan:  Skilled Nursing Facility  In-House Referral:  Clinical Social Work  Discharge planning Services  CM Consult  Post Acute Care Choice:  NA Choice offered to:  Patient  DME Arranged:  N/A DME Agency:  NA  HH Arranged:  NA HH Agency:  [facility] Status of Service:  Completed, signed off  If discussed at [employer] Meetings, dates discussed:    Additional Comments:  Durenda GuthrieBrady, Efrain Clauson Naomi, RN 04/20/2016, 11:34 AM

## 2016-04-20 NOTE — Evaluation (Signed)
Occupational Therapy Evaluation Patient Details Name: Linda Lawson MRN: [id number] DOB: 09/09/1935 Today's Date: 04/20/2016    History of Present Illness 80 yo female admitted on 04/19/16 for elective left THA direct anterior. PMH significant for right THA 2002, R TKA 2015, DM2, HTN    Clinical Impression   PTA Pt independent in ADL and mobility with SPC. Pt currently mod assist for LB ADL, and min guard for ambulation with RW. Pt with performance levels below. Pt lives alone and her children are not local to assist upon d/c so she will require additional therapy and support. Pt will benefit from skilled OT in the acute care setting prior to d/c to venue below to maximize independence and safety in ADL and functional transitions. Pt is very motivated and likes to try and do this for herself, and is very pleasant and cooperative with therapy.     Follow Up Recommendations  SNF;Supervision/Assistance - 24 hour    Equipment Recommendations  Other (comment) (defer to next venue of care)    Recommendations for Other Services       Precautions / Restrictions Precautions Precautions: None Precaution Comments: Direct Anterior Restrictions Weight Bearing Restrictions: Yes LLE Weight Bearing: Weight bearing as tolerated      Mobility Bed Mobility Overal bed mobility: Needs Assistance Bed Mobility: Sit to Supine       Sit to supine: Min assist   General bed mobility comments: min assist for BLE into bed  Transfers Overall transfer level: Needs assistance Equipment used: Rolling walker (2 wheeled) Transfers: Sit to/from Stand Sit to Stand: Min guard         General transfer comment: min guard for safety from recliner, and 3 in 1    Balance Overall balance assessment: Needs assistance Sitting-balance support: No upper extremity supported;Feet supported Sitting balance-Leahy Scale: Good Sitting balance - Comments: EOB no back support   Standing balance support: No  upper extremity supported;During functional activity Standing balance-Leahy Scale: Good Standing balance comment: able to stand at sink for approx 20 min and perform ADL and bathing                            ADL Overall ADL's : Needs assistance/impaired     Grooming: Wash/dry hands;Wash/dry face;Oral care;Applying deodorant;Brushing hair;Supervision/safety;Standing Grooming Details (indicate cue type and reason): sink level ADL, standing for approx 20min with no LOB Upper Body Bathing: Supervision/ safety;Standing Upper Body Bathing Details (indicate cue type and reason): standing at sink Lower Body Bathing: Supervison/ safety;Sit to/from stand Lower Body Bathing Details (indicate cue type and reason): standing at sink for bathing private areas, no further down attempted Upper Body Dressing : Modified independent;Sitting       Toilet Transfer: Min guard;BSC;Comfort height toilet (3 in 1 over toilet) Toilet Transfer Details (indicate cue type and reason): min guard for safety Toileting- Clothing Manipulation and Hygiene: Supervision/safety;Sit to/from stand Toileting - Clothing Manipulation Details (indicate cue type and reason): hospital gown     Functional mobility during ADLs: Rolling walker;Min guard (min guard for safety, increased time required) General ADL Comments: Pt very motivated to do everything for herself, and increase independence     Vision Vision Assessment?: No apparent visual deficits   Perception     Praxis      Pertinent Vitals/Pain Pain Assessment: 0-10 Pain Score: 5  Pain Location: left hip Pain Descriptors / Indicators: Grimacing;Sore Pain Intervention(s): Monitored during session;Repositioned;Ice applied  Hand Dominance Right   Extremity/Trunk Assessment Upper Extremity Assessment Upper Extremity Assessment: Overall WFL for tasks assessed   Lower Extremity Assessment Lower Extremity Assessment: LLE deficits/detail;Defer to PT  evaluation LLE Deficits / Details: pt with normal post op pain and weakness.   Cervical / Trunk Assessment Cervical / Trunk Assessment: Normal   Communication Communication Communication: No difficulties   Cognition Arousal/Alertness: Awake/alert Behavior During Therapy: WFL for tasks assessed/performed Overall Cognitive Status: Within Functional Limits for tasks assessed                     General Comments       Exercises       Shoulder Instructions      Home Living Family/patient expects to be discharged to:: Skilled nursing facility Living Arrangements: Alone                               Additional Comments: Planned on going SNF      Prior Functioning/Environment Level of Independence: Independent with assistive device(s)        Comments: Used cane PRN for longer distance walking. Used [employer] for transportation        OT Problem List: Decreased range of motion;Decreased strength;Decreased activity tolerance;Impaired balance (sitting and/or standing);Decreased knowledge of use of DME or AE;Pain   OT Treatment/Interventions: Self-care/ADL training;DME and/or AE instruction;Therapeutic activities;Patient/family education;Balance training    OT Goals(Current goals can be found in the care plan section) Acute Rehab OT Goals Patient Stated Goal: rehab then home OT Goal Formulation: With patient Time For Goal Achievement: 04/27/16 Potential to Achieve Goals: Good ADL Goals Pt Will Perform Lower Body Bathing: with modified independence;sit to/from stand Pt Will Perform Lower Body Dressing: with modified independence;sit to/from stand Pt Will Perform Tub/Shower Transfer: Tub transfer;with modified independence;ambulating  OT Frequency: Min 2X/week   Barriers to D/C: Decreased caregiver support  Lives Alone       Co-evaluation              End of Session Equipment Utilized During Treatment: Gait belt;Rolling walker Nurse  Communication: Mobility status  Activity Tolerance: Patient tolerated treatment well Patient left: in bed;with call bell/phone within reach   Time: [id number] OT Time Calculation (min): 38 min Charges:  OT General Charges $OT Visit: 1 Procedure OT Evaluation $OT Eval Moderate Complexity: 1 Procedure OT Treatments $Self Care/Home Management : 23-37 mins G-Codes:    Evern BioLaura J Keyshla Tunison 04/20/2016, 4:53 PM  Sherryl MangesLaura Anzley Dibbern OTR/L [phone]

## 2016-04-21 ENCOUNTER — Encounter (HOSPITAL_COMMUNITY): Payer: Self-pay

## 2016-04-21 DIAGNOSIS — F339 Major depressive disorder, recurrent, unspecified: Secondary | ICD-10-CM | POA: Diagnosis not present

## 2016-04-21 DIAGNOSIS — D72829 Elevated white blood cell count, unspecified: Secondary | ICD-10-CM | POA: Diagnosis not present

## 2016-04-21 DIAGNOSIS — J309 Allergic rhinitis, unspecified: Secondary | ICD-10-CM | POA: Diagnosis not present

## 2016-04-21 DIAGNOSIS — M25559 Pain in unspecified hip: Secondary | ICD-10-CM | POA: Diagnosis not present

## 2016-04-21 DIAGNOSIS — R262 Difficulty in walking, not elsewhere classified: Secondary | ICD-10-CM | POA: Diagnosis not present

## 2016-04-21 DIAGNOSIS — N183 Chronic kidney disease, stage 3 (moderate): Secondary | ICD-10-CM | POA: Diagnosis not present

## 2016-04-21 DIAGNOSIS — Z471 Aftercare following joint replacement surgery: Secondary | ICD-10-CM | POA: Diagnosis not present

## 2016-04-21 DIAGNOSIS — K219 Gastro-esophageal reflux disease without esophagitis: Secondary | ICD-10-CM | POA: Diagnosis not present

## 2016-04-21 DIAGNOSIS — D509 Iron deficiency anemia, unspecified: Secondary | ICD-10-CM | POA: Diagnosis not present

## 2016-04-21 DIAGNOSIS — M1612 Unilateral primary osteoarthritis, left hip: Secondary | ICD-10-CM | POA: Diagnosis not present

## 2016-04-21 DIAGNOSIS — R2681 Unsteadiness on feet: Secondary | ICD-10-CM | POA: Diagnosis not present

## 2016-04-21 DIAGNOSIS — R2689 Other abnormalities of gait and mobility: Secondary | ICD-10-CM | POA: Diagnosis not present

## 2016-04-21 DIAGNOSIS — K5901 Slow transit constipation: Secondary | ICD-10-CM | POA: Diagnosis not present

## 2016-04-21 DIAGNOSIS — M1990 Unspecified osteoarthritis, unspecified site: Secondary | ICD-10-CM | POA: Diagnosis not present

## 2016-04-21 DIAGNOSIS — E118 Type 2 diabetes mellitus with unspecified complications: Secondary | ICD-10-CM | POA: Diagnosis not present

## 2016-04-21 DIAGNOSIS — Z96642 Presence of left artificial hip joint: Secondary | ICD-10-CM | POA: Diagnosis not present

## 2016-04-21 DIAGNOSIS — D62 Acute posthemorrhagic anemia: Secondary | ICD-10-CM | POA: Diagnosis not present

## 2016-04-21 DIAGNOSIS — I10 Essential (primary) hypertension: Secondary | ICD-10-CM | POA: Diagnosis not present

## 2016-04-21 DIAGNOSIS — M6281 Muscle weakness (generalized): Secondary | ICD-10-CM | POA: Diagnosis not present

## 2016-04-21 DIAGNOSIS — E119 Type 2 diabetes mellitus without complications: Secondary | ICD-10-CM | POA: Diagnosis not present

## 2016-04-21 DIAGNOSIS — M25552 Pain in left hip: Secondary | ICD-10-CM | POA: Diagnosis not present

## 2016-04-21 DIAGNOSIS — E1169 Type 2 diabetes mellitus with other specified complication: Secondary | ICD-10-CM | POA: Diagnosis not present

## 2016-04-21 DIAGNOSIS — K5900 Constipation, unspecified: Secondary | ICD-10-CM | POA: Diagnosis not present

## 2016-04-21 DIAGNOSIS — E785 Hyperlipidemia, unspecified: Secondary | ICD-10-CM | POA: Diagnosis not present

## 2016-04-21 DIAGNOSIS — D72825 Bandemia: Secondary | ICD-10-CM | POA: Diagnosis not present

## 2016-04-21 DIAGNOSIS — D649 Anemia, unspecified: Secondary | ICD-10-CM | POA: Diagnosis not present

## 2016-04-21 LAB — GLUCOSE, CAPILLARY
GLUCOSE-CAPILLARY: 164 mg/dL — AB (ref 65–99)
Glucose-Capillary: 137 mg/dL — ABNORMAL HIGH (ref 65–99)

## 2016-04-21 NOTE — [place] FL2 (Signed)
[place] MEDICAID FL2 LEVEL OF CARE SCREENING TOOL     IDENTIFICATION  Patient Name: Linda Lawson Birthdate: 1935-09-09 Sex: female Admission Date (Current Location): 04/19/2016  [facility] [place]   Facility and Address:  [facility], [address], [place], [place] [address]      Provider Number: [id number]  Attending Physician Name and Address:  Tarry KosNaiping M Xu, MD  Relative Name and Phone Number:       Current Level of Care: Hospital Recommended Level of Care: Skilled Nursing Facility Prior Approval Number:    Date Approved/Denied:   PASRR Number: [id number]  Discharge Plan: SNF    Current Diagnoses: Patient Active Problem List   Diagnosis Date Noted  . Hip joint replacement status 04/19/2016  . Primary osteoarthritis of left hip     Orientation RESPIRATION BLADDER Height & Weight     Self, Time, Situation, Place  Normal Continent Weight: 84.8 kg (187 lb) Height:     BEHAVIORAL SYMPTOMS/MOOD NEUROLOGICAL BOWEL NUTRITION STATUS   (NONE)  (NONE) Continent Diet (Carb modified)  AMBULATORY STATUS COMMUNICATION OF NEEDS Skin   Extensive Assist Verbally Surgical wounds (left hip)                       Personal Care Assistance Level of Assistance  Bathing, Feeding, Dressing Bathing Assistance: Limited assistance Feeding assistance: Independent Dressing Assistance: Limited assistance     Functional Limitations Info  Sight, Hearing, Speech Sight Info: Adequate Hearing Info: Adequate Speech Info: Adequate    SPECIAL CARE FACTORS FREQUENCY  PT (By licensed PT), OT (By licensed OT)     PT Frequency: 5/day OT Frequency: 5/day            Contractures Contractures Info: Not present    Additional Factors Info  Code Status, Allergies, Insulin Sliding Scale Code Status Info: Full Code Allergies Info: Penicillins, Codeine   Insulin Sliding Scale Info: 3/day       Current Medications (04/21/2016):   This is the current hospital active medication list Current Facility-Administered Medications  Medication Dose Route Frequency Provider Last Rate Last Dose  . 0.9 %  sodium chloride infusion   Intravenous Continuous Tarry KosNaiping M Xu, MD 125 mL/hr at 04/19/16 1703    . acetaminophen (TYLENOL) tablet 650 mg  650 mg Oral Q6H PRN Tarry KosNaiping M Xu, MD   650 mg at 04/21/16 0855   Or  . acetaminophen (TYLENOL) suppository 650 mg  650 mg Rectal Q6H PRN Tarry KosNaiping M Xu, MD      . allopurinol (ZYLOPRIM) tablet 100 mg  100 mg Oral Daily Naiping Donnelly StagerM Xu, MD   100 mg at 04/21/16 0855  . alum & mag hydroxide-simeth (MAALOX/MYLANTA) 200-200-20 MG/5ML suspension 30 mL  30 mL Oral Q4H PRN Tarry KosNaiping M Xu, MD      . aspirin EC tablet 325 mg  325 mg Oral BID Tarry KosNaiping M Xu, MD   325 mg at 04/21/16 0855  . diphenhydrAMINE (BENADRYL) 12.5 MG/5ML elixir 25 mg  25 mg Oral Q4H PRN Naiping Donnelly StagerM Xu, MD      . HYDROmorphone (DILAUDID) tablet 2 mg  2 mg Oral Q3H PRN Tarry KosNaiping M Xu, MD      . insulin aspart (novoLOG) injection 0-15 Units  0-15 Units Subcutaneous TID WC Naiping Donnelly StagerM Xu, MD   2 Units at 04/21/16 [phone]  . insulin aspart (novoLOG) injection 0-5 Units  0-5 Units Subcutaneous QHS Naiping Donnelly StagerM Xu, MD  2 Units at 04/20/16 2122  . insulin glargine (LANTUS) injection 10 Units  10 Units Subcutaneous Daily Naiping Donnelly StagerM Xu, MD   10 Units at 04/21/16 0855  . linagliptin (TRADJENTA) tablet 5 mg  5 mg Oral Q breakfast Naiping Donnelly StagerM Xu, MD   5 mg at 04/21/16 0855  . lisinopril (PRINIVIL,ZESTRIL) tablet 40 mg  40 mg Oral Daily Naiping Donnelly StagerM Xu, MD   40 mg at 04/21/16 0855  . loratadine (CLARITIN) tablet 10 mg  10 mg Oral Daily PRN Naiping Donnelly StagerM Xu, MD      . magnesium citrate solution 1 Bottle  1 Bottle Oral Once PRN Naiping Donnelly StagerM Xu, MD      . meloxicam [facility](MOBIC) tablet 7.5 mg  7.5 mg Oral BID PC Naiping Donnelly StagerM Xu, MD   7.5 mg at 04/21/16 0855  . menthol-cetylpyridinium (CEPACOL) lozenge 3 mg  1 lozenge Oral PRN Naiping Donnelly StagerM Xu, MD       Or  . phenol (CHLORASEPTIC) mouth spray 1  spray  1 spray Mouth/Throat PRN Naiping Donnelly StagerM Xu, MD      . methocarbamol (ROBAXIN) tablet 500 mg  500 mg Oral Q6H PRN Tarry KosNaiping M Xu, MD   500 mg at 04/20/16 1058   Or  . methocarbamol (ROBAXIN) 500 mg in dextrose 5 % 50 mL IVPB  500 mg Intravenous Q6H PRN Naiping Donnelly StagerM Xu, MD      . metoCLOPramide (REGLAN) tablet 5-10 mg  5-10 mg Oral Q8H PRN Naiping Donnelly StagerM Xu, MD       Or  . metoCLOPramide (REGLAN) injection 5-10 mg  5-10 mg Intravenous Q8H PRN Naiping Donnelly StagerM Xu, MD      . morphine 2 MG/ML injection 1 mg  1 mg Intravenous Q2H PRN Tarry KosNaiping M Xu, MD   1 mg at 04/19/16 2238  . ondansetron (ZOFRAN) tablet 4 mg  4 mg Oral Q6H PRN Naiping Donnelly StagerM Xu, MD       Or  . ondansetron [facility](ZOFRAN) injection 4 mg  4 mg Intravenous Q6H PRN Naiping Donnelly StagerM Xu, MD      . polyethylene glycol (MIRALAX / GLYCOLAX) packet 17 g  17 g Oral Daily PRN Tarry KosNaiping M Xu, MD      . pravastatin (PRAVACHOL) tablet 10 mg  10 mg Oral QHS Tarry KosNaiping M Xu, MD   10 mg at 04/20/16 2121  . sorbitol 70 % solution 30 mL  30 mL Oral Daily PRN Naiping Donnelly StagerM Xu, MD         Discharge Medications: Please see discharge summary for a list of discharge medications.  Relevant Imaging Results:  Relevant Lab Results:   Additional Information SSN: [id number]  Venita LickCampbell, Ladonne Sharples B, LCSW

## 2016-04-21 NOTE — Progress Notes (Signed)
Physical Therapy Treatment Patient Details Name: Linda Lawson MRN: [id number] DOB: Oct 14, 1935 Today's Date: 04/21/2016    History of Present Illness 80 yo female admitted on 04/19/16 for elective left THA direct anterior. PMH significant for right THA 2002, R TKA 2015, DM2, HTN     PT Comments    Pt demonstrates improved tolerance for gait this session. Initiated standing Le strengthening exercises with noted difficulty to perform knee flexion in standing. Pt is having increased discomfort in right previously replaced hip this session as well and requires a brief rest break following standing activities before continuing with gait. Pt continues to benefit from SNF placement due to lack of support and living alone.    Follow Up Recommendations  SNF     Equipment Recommendations  Rolling walker with 5" wheels;3in1 (PT)    Recommendations for Other Services       Precautions / Restrictions Precautions Precautions: None Precaution Comments: Direct Anterior Restrictions Weight Bearing Restrictions: Yes LLE Weight Bearing: Weight bearing as tolerated    Mobility  Bed Mobility Overal bed mobility: Needs Assistance Bed Mobility: Sit to Supine       Sit to supine: Min assist   General bed mobility comments: min assist for BLE into bed  Transfers Overall transfer level: Needs assistance Equipment used: Rolling walker (2 wheeled) Transfers: Sit to/from Stand Sit to Stand: Min guard         General transfer comment: Min gaurd for safety from recliner  Ambulation/Gait Ambulation/Gait assistance: Min guard Ambulation Distance (Feet): 70 Feet Assistive device: Rolling walker (2 wheeled) Gait Pattern/deviations: Step-through pattern;Decreased step length - right;Decreased stance time - left;Antalgic Gait velocity: decreased Gait velocity interpretation: Below normal speed for age/gender General Gait Details: Mild antalgic gait with cues for heel strike on  LLE   Stairs            Wheelchair Mobility    Modified Rankin (Stroke Patients Only)       Balance                                    Cognition Arousal/Alertness: Awake/alert Behavior During Therapy: WFL for tasks assessed/performed Overall Cognitive Status: Within Functional Limits for tasks assessed                      Exercises Total Joint Exercises Hip ABduction/ADduction: AROM;Left;10 reps;Standing Knee Flexion: AROM;Left;10 reps;Standing Marching in Standing: AROM;Left;10 reps;Standing    General Comments        Pertinent Vitals/Pain Pain Assessment: 0-10 Pain Score: 4  Pain Location: left hip Pain Descriptors / Indicators: Grimacing;Sore Pain Intervention(s): Premedicated before session;Monitored during session;Ice applied    Home Living                      Prior Function            PT Goals (current goals can now be found in the care plan section) Acute Rehab PT Goals Patient Stated Goal: rehab then home Progress towards PT goals: Progressing toward goals    Frequency    7X/week      PT Plan Current plan remains appropriate    Co-evaluation             End of Session Equipment Utilized During Treatment: Gait belt Activity Tolerance: Patient tolerated treatment well Patient left: with call bell/phone within reach;in bed;with SCD's reapplied  Time: [id number] PT Time Calculation (min) (ACUTE ONLY): 20 min  Charges:  $Gait Training: 8-22 mins                    G Codes:      Colin BroachSabra M. Cheyanna Strick PT, DPT  [phone]  04/21/2016, 3:20 PM

## 2016-04-21 NOTE — Clinical Social Work Placement (Signed)
   CLINICAL SOCIAL WORK PLACEMENT  NOTE  Date:  04/21/2016  Patient Details  Name: Neldon Mcvelyn D Mcniel MRN: [id number] Date of Birth: March 20, 1936  Clinical Social Work is seeking post-discharge placement for this patient at the Skilled  Nursing Facility level of care (*CSW will initial, date and re-position this form in  chart as items are completed):  Yes   Patient/family provided with [place] Clinical Social Work Department's list of facilities offering this level of care within the geographic area requested by the patient (or if unable, by the patient's family).  Yes   Patient/family informed of their freedom to choose among providers that offer the needed level of care, that participate in Medicare, Medicaid or managed care program needed by the patient, have an available bed and are willing to accept the patient.  Yes   Patient/family informed of 's ownership interest in [facility] and [facility], as well as of the fact that they are under no obligation to receive care at these facilities.  PASRR submitted to EDS on       PASRR number received on       Existing PASRR number confirmed on 04/21/16     FL2 transmitted to all facilities in geographic area requested by pt/family on 04/21/16     FL2 transmitted to all facilities within larger geographic area on       Patient informed that his/her managed care company has contracts with or will negotiate with certain facilities, including the following:            Patient/family informed of bed offers received.  Patient chooses bed at       Physician recommends and patient chooses bed at      Patient to be transferred to   on  .  Patient to be transferred to facility by       Patient family notified on   of transfer.  Name of family member notified:        PHYSICIAN Please prepare priority discharge summary, including medications, Please prepare prescriptions, Please sign FL2     Additional Comment:     _______________________________________________ Venita Lickampbell, Kiandra Sanguinetti B, LCSW 04/21/2016, 11:07 AM

## 2016-04-21 NOTE — Clinical Social Work Note (Signed)
CSW met with patient at bedside as the patient is being discharged and SNF has been recommended. The patient is requesting [facility]. Referral has been sent to the facility. CSW has contacted weekend [employer] number to request SNF authorization for [facility]. CSW unable to complete full assessment due to same day discharge.    Bryant  MSW, LCSW, LCASA, [phone] 

## 2016-04-21 NOTE — Progress Notes (Signed)
[facility]. Report called to facility. Pt informed of discharge plan. Pt exhibits no distress and makes no c/o. Has ambulated hall with PT and been in chair this shift. All discharge papers PTAR to deliver. No rx available to send to facility.

## 2016-04-21 NOTE — Discharge Summary (Signed)
Physician Discharge Summary      Patient ID: Linda Lawson MRN: [id number] DOB/AGE: 1935/08/01 80 y.o.  Admit date: 04/19/2016 Discharge date: 04/21/2016  Admission Diagnoses:  <principal problem not specified>  Discharge Diagnoses:  Active Problems:   Primary osteoarthritis of left hip   Hip joint replacement status   Past Medical History:  Diagnosis Date  . Aortic stenosis    s/p AVR (tissue) 2010  . Arthritis   . Complication of anesthesia   . Depression    situatinal  . Diabetes mellitus without complication (HCC)    Type II  . Dyspnea    with exertion  . Gout   . Hyperlipidemia   . Hypertension   . Pneumonia 2010  . PONV (postoperative nausea and vomiting)     Surgeries: Procedure(s): LEFT TOTAL HIP ARTHROPLASTY ANTERIOR APPROACH on 04/19/2016   Consultants (if any):   Discharged Condition: Improved  Hospital Course: Linda Lawson is an 80 y.o. female who was admitted 04/19/2016 with a diagnosis of <principal problem not specified> and went to the operating room on 04/19/2016 and underwent the above named procedures.    She was given perioperative antibiotics:  Anti-infectives    Start     Dose/Rate Route Frequency Ordered Stop   04/19/16 2100  clindamycin (CLEOCIN) IVPB 900 mg     900 mg 100 mL/hr over 30 Minutes Intravenous Every 8 hours 04/19/16 1633 04/20/16 0547   04/19/16 1630  ceFAZolin (ANCEF) IVPB 2g/100 mL premix  Status:  Discontinued     2 g 200 mL/hr over 30 Minutes Intravenous Every 6 hours 04/19/16 1625 04/19/16 1632   04/19/16 1200  clindamycin (CLEOCIN) IVPB 900 mg     900 mg 100 mL/hr over 30 Minutes Intravenous To [facility] 04/19/16 0650 04/19/16 1305    .  She was given sequential compression devices, early ambulation, and aspirin for DVT prophylaxis.  She benefited maximally from the hospital stay and there were no complications.    Recent vital signs:  Vitals:   04/20/16 2000 04/21/16 0624  BP: (!) 148/59 139/82    Pulse: 79 84  Resp: 18 18  Temp: 99.1 F (37.3 C) 98.6 F (37 C)    Recent laboratory studies:  Lab Results  Component Value Date   HGB 9.3 (L) 04/20/2016   HGB 10.4 (L) 04/17/2016   HGB 7.9 (L) 10/13/2010   Lab Results  Component Value Date   WBC 13.6 (H) 04/20/2016   PLT 246 04/20/2016   Lab Results  Component Value Date   INR 1.02 04/17/2016   Lab Results  Component Value Date   NA 138 04/20/2016   K 4.4 04/20/2016   CL 107 04/20/2016   CO2 23 04/20/2016   BUN 12 04/20/2016   CREATININE 1.03 (H) 04/20/2016   GLUCOSE 168 (H) 04/20/2016    Discharge Medications:     Medication List    STOP taking these medications   aspirin 81 MG tablet Replaced by:  aspirin EC 325 MG tablet     TAKE these medications   ACCU-CHEK AVIVA PLUS test strip Generic drug:  glucose blood   ACCU-CHEK SOFTCLIX LANCETS lancets   allopurinol 100 MG tablet Commonly known as:  ZYLOPRIM Take 100 mg by mouth daily.   aspirin EC 325 MG tablet Take 1 tablet (325 mg total) by mouth 2 (two) times daily. Replaces:  aspirin 81 MG tablet   B-D UF III MINI PEN NEEDLES 31G X 5 MM Misc Generic drug:  Insulin Pen Needle   HYDROmorphone 2 MG tablet Commonly known as:  DILAUDID Take 1 tablet (2 mg total) by mouth every 4 (four) hours as needed for severe pain.   LANTUS SOLOSTAR 100 UNIT/ML Solostar Pen Generic drug:  Insulin Glargine Inject 10 Units into the skin daily.   lisinopril 40 MG tablet Commonly known as:  PRINIVIL,ZESTRIL Take 40 mg by mouth daily.   loratadine 10 MG tablet Commonly known as:  CLARITIN Take 10 mg by mouth daily as needed for allergies or rhinitis.   meloxicam 7.5 MG tablet Commonly known as:  MOBIC Take 7.5 mg by mouth 2 (two) times daily after a meal.   methocarbamol 500 MG tablet Commonly known as:  ROBAXIN Take 1 tablet (500 mg total) by mouth every 6 (six) hours as needed for muscle spasms.   ondansetron 4 MG tablet Commonly known as:   ZOFRAN Take 1-2 tablets (4-8 mg total) by mouth every 8 (eight) hours as needed for nausea or vomiting.   pravastatin 10 MG tablet Commonly known as:  PRAVACHOL Take 10 mg by mouth at bedtime.   promethazine 25 MG tablet Commonly known as:  PHENERGAN Take 1 tablet (25 mg total) by mouth every 6 (six) hours as needed for nausea.   senna-docusate 8.6-50 MG tablet Commonly known as:  SENOKOT S Take 1 tablet by mouth at bedtime as needed.   TRADJENTA 5 MG Tabs tablet Generic drug:  linagliptin Take 5 mg by mouth daily with breakfast.            Durable Medical Equipment        Start     Ordered   04/19/16 1625  DME Walker rolling  Once    Question:  Patient needs a walker to treat with the following condition  Answer:  History of hip replacement   04/19/16 1625   04/19/16 1625  DME 3 n 1  Once     04/19/16 1625   04/19/16 1625  DME Bedside commode  Once    Question:  Patient needs a bedside commode to treat with the following condition  Answer:  History of hip replacement   04/19/16 1625      Diagnostic Studies: Dg Pelvis Portable  Result Date: 04/19/2016 CLINICAL DATA:  Post left hip replacement EXAM: PORTABLE PELVIS 1-2 VIEWS COMPARISON:  The pelvis and left hip films of 10/24/2015 FINDINGS: Previous right total hip replacement components are unchanged in position. A new left total hip replacement is present with acetabular and femoral components in good position. No complicating features are seen. Some air is noted in the adjacent soft tissues of the left hip post operatively. The pelvic rami are intact. IMPRESSION: Left hip replacement components in good position. No complicating features pre Electronically Signed   By: Dwyane Dee M.D.   On: 04/19/2016 15:54   Dg C-arm 1-60 Min  Result Date: 04/19/2016 CLINICAL DATA:  Left total hip replacement EXAM: DG C-ARM 61-120 MIN COMPARISON:  Pelvis on left hip films of 10/24/2015 FINDINGS: C-arm fluoroscopy was provided during  left hip replacement. Fluoroscopy time of 37 seconds was recorded. IMPRESSION: C-arm fluoroscopy provided. Electronically Signed   By: Dwyane Dee M.D.   On: 04/19/2016 14:44   Dg Hip Operative Unilat With Pelvis Left  Result Date: 04/19/2016 CLINICAL DATA:  Left total hip replacement EXAM: OPERATIVE left HIP (WITH PELVIS IF PERFORMED) one VIEW TECHNIQUE: Fluoroscopic spot image(s) were submitted for interpretation post-operatively. COMPARISON:  Pelvis and left hip films  of 10/24/2015 FINDINGS: A single C-arm spot film shows the acetabular and femoral components of the left hip replacement to be in good position. No fracture is seen. IMPRESSION: Left total hip replacement components in good position with no complicating features. Electronically Signed   By: Dwyane DeePaul  Barry M.D.   On: 04/19/2016 14:45    Disposition: 03-Skilled Nursing Facility  Discharge Instructions    Call MD / Call 911    Complete by:  As directed    If you experience chest pain or shortness of breath, CALL 911 and be transported to the hospital emergency room.  If you develope a fever above 101.5 F, pus (white drainage) or increased drainage or redness at the wound, or calf pain, call your surgeon's office.   Constipation Prevention    Complete by:  As directed    Drink plenty of fluids.  Prune juice may be helpful.  You may use a stool softener, such as Colace (over the counter) 100 mg twice a day.  Use MiraLax (over the counter) for constipation as needed.   Driving restrictions    Complete by:  As directed    No driving while taking narcotic pain meds.   Increase activity slowly as tolerated    Complete by:  As directed       Follow-up Information    Glee ArvinMichael Rannie Craney, MD In 2 weeks.   Specialty:  Orthopedic Surgery Why:  For suture removal, For wound re-check Contact information: [address] [place] [address] [phone]            Signed: Glee ArvinMichael Tamarcus Condie 04/21/2016, 9:34 AM

## 2016-04-21 NOTE — Progress Notes (Addendum)
   Subjective:  Patient reports pain as mild.  Doing better.  Objective:   VITALS:   Vitals:   04/20/16 1056 04/20/16 1237 04/20/16 2000 04/21/16 0624  BP: 133/61 (!) 146/67 (!) 148/59 139/82  Pulse: 72 76 79 84  Resp: 18 (!) 21 18 18   Temp: 99 F (37.2 C) 98.5 F (36.9 C) 99.1 F (37.3 C) 98.6 F (37 C)  TempSrc: Oral Oral Oral Oral  SpO2: 100% 100% 100% 100%  Weight:        Neurologically intact Neurovascular intact Sensation intact distally Intact pulses distally Dorsiflexion/Plantar flexion intact Incision: dressing C/D/I and no drainage No cellulitis present Compartment soft   Lab Results  Component Value Date   WBC 13.6 (H) 04/20/2016   HGB 9.3 (L) 04/20/2016   HCT 29.0 (L) 04/20/2016   MCV 75.7 (L) 04/20/2016   PLT 246 04/20/2016     Assessment/Plan:  2 Days Post-Op   - Expected postop acute blood loss anemia - will monitor for symptoms - Up with PT/OT - DVT ppx - SCDs, ambulation, aspirin - WBAT operative extremity - Pain control - Discharge planning - Stable for discharge from orthopedic standpoint. [facility]  Michael Xu 04/21/2016, 9:33 AM [phone]

## 2016-04-21 NOTE — Progress Notes (Signed)
Physical Therapy Treatment Patient Details Name: Linda Lawson MRN: [id number] DOB: April 03, 1936 Today's Date: 04/21/2016    History of Present Illness 80 yo female admitted on 04/19/16 for elective left THA direct anterior. PMH significant for right THA 2002, R TKA 2015, DM2, HTN     PT Comments    Pt is POD 2 and moving well with therapy. Performed gait x increased distance this session and assisted pt into bathroom for toileting and hand hygiene. Pt demonstrates improved ability to flex left hip with heel slide this session and improved active abduction with supine exercises.    Follow Up Recommendations  SNF     Equipment Recommendations  Other (comment);Rolling walker with 5" wheels;3in1 (PT)    Recommendations for Other Services       Precautions / Restrictions Precautions Precautions: None Precaution Comments: Direct Anterior Restrictions Weight Bearing Restrictions: Yes LLE Weight Bearing: Weight bearing as tolerated    Mobility  Bed Mobility Overal bed mobility: Needs Assistance Bed Mobility: Supine to Sit     Supine to sit: Min guard;HOB elevated     General bed mobility comments: min assist for BLE into bed  Transfers Overall transfer level: Needs assistance Equipment used: Rolling walker (2 wheeled) Transfers: Sit to/from Stand Sit to Stand: Min guard         General transfer comment: Min guard for safety from EOB and commode  Ambulation/Gait Ambulation/Gait assistance: Min assist Ambulation Distance (Feet): 65 Feet Assistive device: Rolling walker (2 wheeled) Gait Pattern/deviations: Step-through pattern;Decreased step length - right;Decreased stance time - left;Antalgic Gait velocity: decreased Gait velocity interpretation: Below normal speed for age/gender General Gait Details: Mild antalgic gait with cues for heel strike on LLE   Stairs            Wheelchair Mobility    Modified Rankin (Stroke Patients Only)       Balance                                     Cognition Arousal/Alertness: Awake/alert Behavior During Therapy: WFL for tasks assessed/performed Overall Cognitive Status: Within Functional Limits for tasks assessed                      Exercises Total Joint Exercises Ankle Circles/Pumps: AROM;Both;20 reps;Supine Quad Sets: AROM;Left;10 reps;Supine Heel Slides: AROM;Left;10 reps;Supine Hip ABduction/ADduction: AROM;Left;10 reps;Supine    General Comments        Pertinent Vitals/Pain Pain Assessment: 0-10 Pain Score: 4  Pain Location: left hip Pain Descriptors / Indicators: Grimacing;Sore Pain Intervention(s): Monitored during session;Ice applied;Repositioned    Home Living                      Prior Function            PT Goals (current goals can now be found in the care plan section) Acute Rehab PT Goals Patient Stated Goal: rehab then home Progress towards PT goals: Progressing toward goals    Frequency    7X/week      PT Plan Current plan remains appropriate    Co-evaluation             End of Session Equipment Utilized During Treatment: Gait belt Activity Tolerance: Patient tolerated treatment well Patient left: in chair;with call bell/phone within reach     Time: 0837-0859 PT Time Calculation (min) (ACUTE ONLY): 22 min  Charges:  $  Gait Training: 8-22 mins                    G Codes:      Colin BroachSabra M. Leeann Bady PT, DPT  [phone]  04/21/2016, 9:26 AM

## 2016-04-21 NOTE — Clinical Social Work Placement (Signed)
   CLINICAL SOCIAL WORK PLACEMENT  NOTE  Date:  04/21/2016  Patient Details  Name: Linda Lawson MRN: [id number] Date of Birth: 1935/07/17  Clinical Social Work is seeking post-discharge placement for this patient at the Skilled  Nursing Facility level of care (*CSW will initial, date and re-position this form in  chart as items are completed):  Yes   Patient/family provided with [facility] Clinical Social Work Department's list of facilities offering this level of care within the geographic area requested by the patient (or if unable, by the patient's family).  Yes   Patient/family informed of their freedom to choose among providers that offer the needed level of care, that participate in Medicare, Medicaid or managed care program needed by the patient, have an available bed and are willing to accept the patient.  Yes   Patient/family informed of [facility]'s ownership interest in [facility] and [facility], as well as of the fact that they are under no obligation to receive care at these facilities.  PASRR submitted to EDS on       PASRR number received on       Existing PASRR number confirmed on 04/21/16     FL2 transmitted to all facilities in geographic area requested by pt/family on 04/21/16     FL2 transmitted to all facilities within larger geographic area on       Patient informed that his/her managed care company has contracts with or will negotiate with certain facilities, including the following:        Yes   Patient/family informed of bed offers received.  [facility]     Physician recommends and patient chooses bed at      [facility].  Patient to be transferred to facility by Ambulance     Patient family notified on 04/21/16 of transfer.  Name of family member notified:  patient notifying family.     PHYSICIAN Please prepare priority discharge summary, including medications,  Please prepare prescriptions, Please sign FL2     Additional Comment:  Robin SearingSilverback auth = [id number]  _______________________________________________ Venita Lickampbell, Lurena Naeve B, LCSW 04/21/2016, 1:41 PM

## 2016-04-23 ENCOUNTER — Non-Acute Institutional Stay (SKILLED_NURSING_FACILITY): Payer: Commercial Managed Care - HMO | Admitting: Adult Health

## 2016-04-23 ENCOUNTER — Encounter: Payer: Self-pay | Admitting: Adult Health

## 2016-04-23 DIAGNOSIS — K5901 Slow transit constipation: Secondary | ICD-10-CM

## 2016-04-23 DIAGNOSIS — J309 Allergic rhinitis, unspecified: Secondary | ICD-10-CM | POA: Diagnosis not present

## 2016-04-23 DIAGNOSIS — N183 Chronic kidney disease, stage 3 unspecified: Secondary | ICD-10-CM

## 2016-04-23 DIAGNOSIS — D72829 Elevated white blood cell count, unspecified: Secondary | ICD-10-CM | POA: Diagnosis not present

## 2016-04-23 DIAGNOSIS — D62 Acute posthemorrhagic anemia: Secondary | ICD-10-CM

## 2016-04-23 DIAGNOSIS — E785 Hyperlipidemia, unspecified: Secondary | ICD-10-CM

## 2016-04-23 DIAGNOSIS — R2681 Unsteadiness on feet: Secondary | ICD-10-CM | POA: Diagnosis not present

## 2016-04-23 DIAGNOSIS — Z794 Long term (current) use of insulin: Secondary | ICD-10-CM

## 2016-04-23 DIAGNOSIS — IMO0002 Reserved for concepts with insufficient information to code with codable children: Secondary | ICD-10-CM

## 2016-04-23 DIAGNOSIS — I10 Essential (primary) hypertension: Secondary | ICD-10-CM

## 2016-04-23 DIAGNOSIS — M1612 Unilateral primary osteoarthritis, left hip: Secondary | ICD-10-CM

## 2016-04-23 DIAGNOSIS — E118 Type 2 diabetes mellitus with unspecified complications: Secondary | ICD-10-CM | POA: Diagnosis not present

## 2016-04-23 DIAGNOSIS — E1165 Type 2 diabetes mellitus with hyperglycemia: Secondary | ICD-10-CM

## 2016-04-23 NOTE — Progress Notes (Signed)
DATE:  04/23/2016   MRN:  [id number]  BIRTHDAY: [birth year]  Facility:  Nursing Home Location:  [facility]  Nursing Home Room Number: 1203-P  LEVEL OF CARE:  SNF [phone])  Contact Information    Name Relation Home Work [employer]   Stoermer,Angela Daughter [phone]     Danley Dankerarler,Calvin Son [phone]         Code Status History    Date Active Date Inactive Code Status Order ID Comments User Context   04/19/2016  4:25 PM 04/21/2016  6:59 PM Full Code [id number]  Tarry KosNaiping M Xu, MD Inpatient       Chief Complaint  Patient presents with  . Hospitalization Follow-up    HISTORY OF PRESENT ILLNESS:  The patient is a 80 year old female admitted [facility] from 04/19/16-04/21/16.She has PMH of aortic stenosis S/P AVR (tissue) 2010, depression, type 2 diabetes mellitus, gout, hyperlipidemia and hypertension. She has primary osteoarthritis of left hip for which she had left total hip arthroplasty on 04/19/16.  She has been admitted for a short-term rehabilitation.  She was seen today in her and complained of constipation.  PAST MEDICAL HISTORY:  Past Medical History:  Diagnosis Date  . Aortic stenosis    s/p AVR (tissue) 2010  . Arthritis   . Complication of anesthesia   . Depression    situatinal  . Diabetes mellitus without complication (HCC)    Type II  . Dyspnea    with exertion  . Gout   . Hip joint replacement status   . Hyperlipidemia   . Hypertension   . Pneumonia 2010  . PONV (postoperative nausea and vomiting)   . Primary osteoarthritis of left hip      CURRENT MEDICATIONS: Reviewed  Patient's Medications  New Prescriptions   No medications on file  Previous Medications   ACCU-CHEK AVIVA PLUS TEST STRIP       ACCU-CHEK SOFTCLIX LANCETS LANCETS       ALLOPURINOL (ZYLOPRIM) 100 MG TABLET    Take 100 mg by mouth daily.    ASPIRIN EC 325 MG TABLET    Take 1 tablet (325 mg total) by mouth 2  (two) times daily.   ATORVASTATIN (LIPITOR) 10 MG TABLET    Take 10 mg by mouth every evening.   B-D UF III MINI PEN NEEDLES 31G X 5 MM MISC       HYDROMORPHONE (DILAUDID) 2 MG TABLET    Take 1 tablet (2 mg total) by mouth every 4 (four) hours as needed for severe pain.   LANTUS SOLOSTAR 100 UNIT/ML SOLOSTAR PEN    Inject 10 Units into the skin daily.    LISINOPRIL (PRINIVIL,ZESTRIL) 40 MG TABLET    Take 40 mg by mouth daily.    LORATADINE (CLARITIN) 10 MG TABLET    Take 10 mg by mouth daily as needed for allergies or rhinitis.   MELOXICAM (MOBIC) 7.5 MG TABLET    Take 7.5 mg by mouth 2 (two) times daily after a meal.    METHOCARBAMOL (ROBAXIN) 500 MG TABLET    Take 1 tablet (500 mg total) by mouth every 6 (six) hours as needed for muscle spasms.   ONDANSETRON (ZOFRAN) 4 MG TABLET    Take 1-2 tablets (4-8 mg total) by mouth every 8 (eight) hours as needed for nausea or vomiting.   PROMETHAZINE (PHENERGAN) 25 MG TABLET    Take 1 tablet (25 mg total) by mouth every  6 (six) hours as needed for nausea.   SENNA-DOCUSATE (SENOKOT S) 8.6-50 MG TABLET    Take 1 tablet by mouth at bedtime as needed.   TRADJENTA 5 MG TABS TABLET    Take 5 mg by mouth daily with breakfast.   Modified Medications   No medications on file  Discontinued Medications   PRAVASTATIN (PRAVACHOL) 10 MG TABLET    Take 10 mg by mouth at bedtime.      Allergies  Allergen Reactions  . Penicillins Swelling and Rash    Has patient had a PCN reaction causing immediate rash, facial/tongue/throat swelling, SOB or lightheadedness with hypotension: Yes Has patient had a PCN reaction causing severe rash involving mucus membranes or skin necrosis: No Has patient had a PCN reaction that required hospitalization: No Has patient had a PCN reaction occurring within the last 10 years: No If all of the above answers are "NO", then may proceed with Cephalosporin use.   . Codeine Nausea And Vomiting     REVIEW OF SYSTEMS:  GENERAL: no  change in appetite, no fatigue, no weight changes, no fever, chills or weakness EYES: Denies change in vision, dry eyes, eye pain, itching or discharge EARS: Denies change in hearing, ringing in ears, or earache NOSE: Denies nasal congestion or epistaxis MOUTH and THROAT: Denies oral discomfort, gingival pain or bleeding, pain from teeth or hoarseness   RESPIRATORY: no cough, SOB, DOE, wheezing, hemoptysis CARDIAC: no chest pain, edema or palpitations GI: no abdominal pain, diarrhea, heart burn, nausea or vomiting,+ constipation GU: Denies dysuria, frequency, hematuria, incontinence, or discharge PSYCHIATRIC: Denies feeling of depression or anxiety. No report of hallucinations, insomnia, paranoia, or agitation    PHYSICAL EXAMINATION  GENERAL APPEARANCE: Well nourished. In no acute distress. Normal body habitus SKIN:  Left hip surgical incision is covered with Aquacel dressing, dry and no erythema HEAD: Normal in size and contour. No evidence of trauma EYES: Lids open and close normally. No blepharitis, entropion or ectropion. PERRL. Conjunctivae are clear and sclerae are white. Lenses are without opacity EARS: Pinnae are normal. Patient hears normal voice tunes of the examiner MOUTH and THROAT: Lips are without lesions. Oral mucosa is moist and without lesions. Tongue is normal in shape, size, and color and without lesions NECK: supple, trachea midline, no neck masses, no thyroid tenderness, no thyromegaly LYMPHATICS: no LAN in the neck, no supraclavicular LAN RESPIRATORY: breathing is even & unlabored, BS CTAB CARDIAC: RRR, no murmur,no extra heart sounds, no edema GI: abdomen soft, normal BS, no masses, no tenderness, no hepatomegaly, no splenomegaly EXTREMITIES:  Able to move 4 extremities PSYCHIATRIC: Alert and oriented X 3. Affect and behavior are appropriate  LABS/RADIOLOGY: Labs reviewed: Basic Metabolic Panel:  Recent Labs  08/65/7810/09/28 1314 04/20/16 0442  NA 138 138  K  4.3 4.4  CL 108 107  CO2 25 23  GLUCOSE 129* 168*  BUN 14 12  CREATININE 0.97 1.03*  CALCIUM 9.3 8.7*   Liver Function Tests:  Recent Labs  04/17/16 1314  AST 22  ALT 18  ALKPHOS 76  BILITOT 0.4  PROT 6.6  ALBUMIN 3.7   CBC:  Recent Labs  04/17/16 1314 04/20/16 0442  WBC 8.6 13.6*  NEUTROABS 5.5  --   HGB 10.4* 9.3*  HCT 33.0* 29.0*  MCV 75.7* 75.7*  PLT 276 246   CBG:    Recent Labs  04/20/16 2111 04/21/16 0615 04/21/16 1147  GLUCAP 215* 137* 164*     Dg Pelvis Portable  Result Date: 04/19/2016 CLINICAL DATA:  Post left hip replacement EXAM: PORTABLE PELVIS 1-2 VIEWS COMPARISON:  The pelvis and left hip films of 10/24/2015 FINDINGS: Previous right total hip replacement components are unchanged in position. A new left total hip replacement is present with acetabular and femoral components in good position. No complicating features are seen. Some air is noted in the adjacent soft tissues of the left hip post operatively. The pelvic rami are intact. IMPRESSION: Left hip replacement components in good position. No complicating features pre Electronically Signed   By: Dwyane Dee M.D.   On: 04/19/2016 15:54   Dg C-arm 1-60 Min  Result Date: 04/19/2016 CLINICAL DATA:  Left total hip replacement EXAM: DG C-ARM 61-120 MIN COMPARISON:  Pelvis on left hip films of 10/24/2015 FINDINGS: C-arm fluoroscopy was provided during left hip replacement. Fluoroscopy time of 37 seconds was recorded. IMPRESSION: C-arm fluoroscopy provided. Electronically Signed   By: Dwyane Dee M.D.   On: 04/19/2016 14:44   Dg Hip Operative Unilat With Pelvis Left  Result Date: 04/19/2016 CLINICAL DATA:  Left total hip replacement EXAM: OPERATIVE left HIP (WITH PELVIS IF PERFORMED) one VIEW TECHNIQUE: Fluoroscopic spot image(s) were submitted for interpretation post-operatively. COMPARISON:  Pelvis and left hip films of 10/24/2015 FINDINGS: A single C-arm spot film shows the acetabular and femoral  components of the left hip replacement to be in good position. No fracture is seen. IMPRESSION: Left total hip replacement components in good position with no complicating features. Electronically Signed   By: Dwyane Dee M.D.   On: 04/19/2016 14:45    ASSESSMENT/PLAN:  Unsteady gait - for rehabilitation, PT and OT, for therapeutic strengthening exercises; fall precaution  Osteoarthritis of left hip S/P left total hip arthroplasty - for rehabilitation, PT and OT, for therapeutic strengthening exercises; continue aspirin EC 325 mg 1 tab by mouth twice a day for DVT prophylaxis; Dilaudid 2 mg 1 tab by mouth every 4 hours when necessary for pain and discontinue Mobic; Robaxin 500 mg 1 tab by mouth every 6 hours when necessary for muscle spasm; follow-up with orthopedic surgeon, Dr. Roda Shutters, 2 weeks  Diabetes mellitus, type II - continue Lantus 10 units subcutaneous daily and Tradjenta 5 mg 1 tab by mouth daily Lab Results  Component Value Date   HGBA1C 7.9 (H) 04/17/2016   Hypertension - continue lisinopril 40 mg 1 tab by mouth daily  Allergic rhinitis - continue Claritin 10 mg 1 tab by mouth daily when necessary  Hyperlipidemia - continue Pravachol 10 mg 1 tab by mouth daily at bedtime  Constipation - start senna S 2 tabs by mouth daily at bedtime  CKD, stage III - creatinine 1.03 and GFR 59; check BMP  Leukocytosis - wbc 13.6; no fever nor cough; check CBC  Anemia, acute blood loss - hemoglobin 9.3; check CBC     Goals of care:  Short-term rehabilitation   Mihira Tozzi C. Medina-Vargas  - NP [employer] [phone]

## 2016-04-24 ENCOUNTER — Non-Acute Institutional Stay (SKILLED_NURSING_FACILITY): Payer: Commercial Managed Care - HMO | Admitting: Internal Medicine

## 2016-04-24 ENCOUNTER — Encounter: Payer: Self-pay | Admitting: Internal Medicine

## 2016-04-24 DIAGNOSIS — E785 Hyperlipidemia, unspecified: Secondary | ICD-10-CM

## 2016-04-24 DIAGNOSIS — R2681 Unsteadiness on feet: Secondary | ICD-10-CM | POA: Diagnosis not present

## 2016-04-24 DIAGNOSIS — E669 Obesity, unspecified: Secondary | ICD-10-CM

## 2016-04-24 DIAGNOSIS — D72825 Bandemia: Secondary | ICD-10-CM | POA: Diagnosis not present

## 2016-04-24 DIAGNOSIS — I10 Essential (primary) hypertension: Secondary | ICD-10-CM | POA: Diagnosis not present

## 2016-04-24 DIAGNOSIS — D62 Acute posthemorrhagic anemia: Secondary | ICD-10-CM

## 2016-04-24 DIAGNOSIS — K5900 Constipation, unspecified: Secondary | ICD-10-CM | POA: Diagnosis not present

## 2016-04-24 DIAGNOSIS — E1169 Type 2 diabetes mellitus with other specified complication: Secondary | ICD-10-CM | POA: Diagnosis not present

## 2016-04-24 DIAGNOSIS — M1612 Unilateral primary osteoarthritis, left hip: Secondary | ICD-10-CM

## 2016-04-24 DIAGNOSIS — N289 Disorder of kidney and ureter, unspecified: Secondary | ICD-10-CM

## 2016-04-24 DIAGNOSIS — K219 Gastro-esophageal reflux disease without esophagitis: Secondary | ICD-10-CM | POA: Diagnosis not present

## 2016-04-24 LAB — CBC AND DIFFERENTIAL
HEMATOCRIT: 31 % — AB (ref 36–46)
HEMOGLOBIN: 9.9 g/dL — AB (ref 12.0–16.0)
Neutrophils Absolute: 9 /uL
Platelets: 314 10*3/uL (ref 150–399)
WBC: 13.7 10^3/mL

## 2016-04-24 LAB — BASIC METABOLIC PANEL
BUN: 23 mg/dL — AB (ref 4–21)
CREATININE: 0.9 mg/dL (ref 0.5–1.1)
Glucose: 162 mg/dL
POTASSIUM: 4.5 mmol/L (ref 3.4–5.3)
Sodium: 140 mmol/L (ref 137–147)

## 2016-04-24 NOTE — Progress Notes (Signed)
LOCATION: [facility]  PCP: Maryelizabeth RowanEWEY,ELIZABETH, MD   Code Status: Full Code  Goals of care: Advanced Directive information Advanced Directives 04/20/2016  Does Patient Have a Medical Advance Directive? No  Does patient want to make changes to medical advance directive? No - Patient declined  Would patient like information on creating a medical advance directive? No - Patient declined       Extended Emergency Contact Information Primary Emergency Contact: Remonia Richterarler,Angela  [place] Home Phone: [phone] Relation: Daughter Secondary Emergency Contact: Howell RucksParler,Calvin Address: [address]          [place], [place] [id number] [place] Home Phone: [phone] Relation: Son   Allergies  Allergen Reactions  . Penicillins Swelling and Rash    Has patient had a PCN reaction causing immediate rash, facial/tongue/throat swelling, SOB or lightheadedness with hypotension: Yes Has patient had a PCN reaction causing severe rash involving mucus membranes or skin necrosis: No Has patient had a PCN reaction that required hospitalization: No Has patient had a PCN reaction occurring within the last 10 years: No If all of the above answers are "NO", then may proceed with Cephalosporin use.   . Codeine Nausea And Vomiting    Chief Complaint  Patient presents with  . New Admit To SNF    New Admission Visit     HPI:  Patient is a 80 y.o. female seen today for short term rehabilitation post hospital admission from 04/19/16-04/21/16 with left hip OA. She underwent left total hip arthroplasty. She is seen in her room today.  Review of Systems:  Constitutional: Negative for fever, chills, diaphoresis. Energy level is slowly coming back.  HENT: Negative for headache, congestion, nasal discharge. Eyes: Negative for blurred vision, double vision and discharge. Wears glasses.  Respiratory: Negative for cough, shortness of breath and wheezing.   Cardiovascular:  Negative for chest pain, palpitations, leg swelling.  Gastrointestinal: Negative for nausea, vomiting, abdominal pain, loss of appetite. Positive for heartburn. Last bowel movement was 2 days ago. At home had a bowel movement every other day. Passing gas. Genitourinary: Negative for dysuria and flank pain.  Musculoskeletal: Negative for back pain, fall in the facility.  Skin: Negative for itching, rash.  Neurological: Negative for dizziness. Psychiatric/Behavioral: Negative for depression.   Past Medical History:  Diagnosis Date  . Aortic stenosis    s/p AVR (tissue) 2010  . Arthritis   . Complication of anesthesia   . Depression    situatinal  . Diabetes mellitus without complication (HCC)    Type II  . Dyspnea    with exertion  . Gout   . Hip joint replacement status   . Hyperlipidemia   . Hypertension   . Pneumonia 2010  . PONV (postoperative nausea and vomiting)   . Primary osteoarthritis of left hip    Past Surgical History:  Procedure Laterality Date  . ABDOMINAL HYSTERECTOMY    . COLONOSCOPY    . HIP ARTHROPLASTY Right 2002  . JOINT REPLACEMENT    . KNEE ARTHROPLASTY Left 2012  . pericardial tissue heart valve  2010   AVR  . TOTAL HIP ARTHROPLASTY Left 04/19/2016  . TOTAL HIP ARTHROPLASTY Left 04/19/2016   Procedure: LEFT TOTAL HIP ARTHROPLASTY ANTERIOR APPROACH;  Surgeon: Tarry KosNaiping M Xu, MD;  Location: [facility] OR;  Service: Orthopedics;  Laterality: Left;   Social History:   reports that she quit smoking about 7 years ago. She quit after 20.00 years of use. She has never  used smokeless tobacco. She reports that she does not drink alcohol or use drugs.  No family history on file.  Medications:   Medication List       Accurate as of 04/24/16 12:50 PM. Always use your most recent med list.          ACCU-CHEK AVIVA PLUS test strip Generic drug:  glucose blood   ACCU-CHEK SOFTCLIX LANCETS lancets   allopurinol 100 MG tablet Commonly known as:  ZYLOPRIM Take  100 mg by mouth daily.   aspirin EC 325 MG tablet Take 1 tablet (325 mg total) by mouth 2 (two) times daily.   B-D UF III MINI PEN NEEDLES 31G X 5 MM Misc Generic drug:  Insulin Pen Needle   HYDROmorphone 2 MG tablet Commonly known as:  DILAUDID Take 1 tablet (2 mg total) by mouth every 4 (four) hours as needed for severe pain.   LANTUS SOLOSTAR 100 UNIT/ML Solostar Pen Generic drug:  Insulin Glargine Inject 10 Units into the skin daily.   LIPITOR 10 MG tablet Generic drug:  atorvastatin Take 10 mg by mouth every evening.   lisinopril 40 MG tablet Commonly known as:  PRINIVIL,ZESTRIL Take 40 mg by mouth daily.   loratadine 10 MG tablet Commonly known as:  CLARITIN Take 10 mg by mouth daily as needed for allergies or rhinitis.   methocarbamol 500 MG tablet Commonly known as:  ROBAXIN Take 1 tablet (500 mg total) by mouth every 6 (six) hours as needed for muscle spasms.   ondansetron 4 MG tablet Commonly known as:  ZOFRAN Take 1-2 tablets (4-8 mg total) by mouth every 8 (eight) hours as needed for nausea or vomiting.   promethazine 25 MG tablet Commonly known as:  PHENERGAN Take 1 tablet (25 mg total) by mouth every 6 (six) hours as needed for nausea.   sennosides-docusate sodium 8.6-50 MG tablet Commonly known as:  SENOKOT-S Take 2 tablets by mouth at bedtime.   TRADJENTA 5 MG Tabs tablet Generic drug:  linagliptin Take 5 mg by mouth daily with breakfast.       Immunizations: Immunization History  Administered Date(s) Administered  . Influenza-Unspecified 02/11/2013     Physical Exam:  Vitals:   04/24/16 1244  BP: (!) 149/70  Pulse: 94  Resp: 16  Temp: 98.2 F (36.8 C)  TempSrc: Oral  SpO2: 99%  Weight: 187 lb (84.8 kg)  Height: 5\' 4"  (1.626 m)   Body mass index is 32.1 kg/m.  General- elderly female, obese, in no acute distress Head- normocephalic, atraumatic Nose- no maxillary or frontal sinus tenderness, no nasal discharge Throat- moist  mucus membrane Eyes- PERRLA, EOMI, no pallor, no icterus, no discharge, normal conjunctiva, normal sclera Neck- no cervical lymphadenopathy Cardiovascular- irregular heart rate, + murmur Respiratory- bilateral clear to auscultation, no wheeze, no rhonchi, no crackles, no use of accessory muscles Abdomen- bowel sounds present, soft, non tender Musculoskeletal- able to move all 4 extremities, limited left hip range of motion, ted hose in place Neurological- alert and oriented to person, place and time Skin- warm and dry, left hip surgical incision with aquacel dressing clean and dry Psychiatry- normal mood and affect    Labs reviewed: Basic Metabolic Panel:  Recent Labs  16/02/9611/05/17 1314 04/20/16 0442  NA 138 138  K 4.3 4.4  CL 108 107  CO2 25 23  GLUCOSE 129* 168*  BUN 14 12  CREATININE 0.97 1.03*  CALCIUM 9.3 8.7*   Liver Function Tests:  Recent Labs  04/17/16  1314  AST 22  ALT 18  ALKPHOS 76  BILITOT 0.4  PROT 6.6  ALBUMIN 3.7   No results for input(s): LIPASE, AMYLASE in the last 8760 hours. No results for input(s): AMMONIA in the last 8760 hours. CBC:  Recent Labs  04/17/16 1314 04/20/16 0442  WBC 8.6 13.6*  NEUTROABS 5.5  --   HGB 10.4* 9.3*  HCT 33.0* 29.0*  MCV 75.7* 75.7*  PLT 276 246   Cardiac Enzymes: No results for input(s): CKTOTAL, CKMB, CKMBINDEX, TROPONINI in the last 8760 hours. BNP: Invalid input(s): POCBNP CBG:  Recent Labs  04/20/16 2111 04/21/16 0615 04/21/16 1147  GLUCAP 215* 137* 164*    Radiological Exams: Dg Pelvis Portable  Result Date: 04/19/2016 CLINICAL DATA:  Post left hip replacement EXAM: PORTABLE PELVIS 1-2 VIEWS COMPARISON:  The pelvis and left hip films of 10/24/2015 FINDINGS: Previous right total hip replacement components are unchanged in position. A new left total hip replacement is present with acetabular and femoral components in good position. No complicating features are seen. Some air is noted in the  adjacent soft tissues of the left hip post operatively. The pelvic rami are intact. IMPRESSION: Left hip replacement components in good position. No complicating features pre Electronically Signed   By: Dwyane Dee M.D.   On: 04/19/2016 15:54   Dg C-arm 1-60 Min  Result Date: 04/19/2016 CLINICAL DATA:  Left total hip replacement EXAM: DG C-ARM 61-120 MIN COMPARISON:  Pelvis on left hip films of 10/24/2015 FINDINGS: C-arm fluoroscopy was provided during left hip replacement. Fluoroscopy time of 37 seconds was recorded. IMPRESSION: C-arm fluoroscopy provided. Electronically Signed   By: Dwyane Dee M.D.   On: 04/19/2016 14:44   Dg Hip Operative Unilat With Pelvis Left  Result Date: 04/19/2016 CLINICAL DATA:  Left total hip replacement EXAM: OPERATIVE left HIP (WITH PELVIS IF PERFORMED) one VIEW TECHNIQUE: Fluoroscopic spot image(s) were submitted for interpretation post-operatively. COMPARISON:  Pelvis and left hip films of 10/24/2015 FINDINGS: A single C-arm spot film shows the acetabular and femoral components of the left hip replacement to be in good position. No fracture is seen. IMPRESSION: Left total hip replacement components in good position with no complicating features. Electronically Signed   By: Dwyane Dee M.D.   On: 04/19/2016 14:45    Assessment/Plan  Unsteady gait With left hip surgery. Will have patient work with PT/OT as tolerated to regain strength and restore function.  Fall precautions are in place.  Left hip OA S/p left hip arthroplasty. Has orthopedic follow up. Continue dilaudid 2 mg q4h prn pain and robaxin 500 mg q6h prn muscle spasm. Continue aspirin 325 mg bid for DVT prophylaxis. Will have her work with physical therapy and occupational therapy team to help with gait training and muscle strengthening exercises.fall precautions. Skin care. Encourage to be out of bed. PMR consult.   Blood loss anemia Post op, monitor cbc  Leukocytosis Afebrile, monitor  cbc  Constipation Started on senna s 2 tab qhs yesterday. Add miralax daily x 2 days, then daily as needed and monitor.  gerd Add ranitidine 150 mg daily in am, advised to sit upright post meals, monitor  Dm type 2 Lab Results  Component Value Date   HGBA1C 7.9 (H) 04/17/2016   Monitor cbg and continue tradjenta with her lantus  HTN Monitor bp, cont lisinopril  HLD On atorvastatin, no changes made  Impaired renal function Monitor bmp    Goals of care: short term rehabilitation   Labs/tests  ordered: cbc, bmp  Family/ staff Communication: reviewed care plan with patient and nursing supervisor    Oneal Grout, MD Internal Medicine [facility] [address] [place], [place] [address] Cell Phone (Monday-Friday 8 am - 5 pm): [phone] On Call: [phone] and follow prompts after 5 pm and on weekends Office Phone: [phone] Office Fax: [phone]

## 2016-04-26 DIAGNOSIS — R262 Difficulty in walking, not elsewhere classified: Secondary | ICD-10-CM | POA: Diagnosis not present

## 2016-04-26 DIAGNOSIS — M6281 Muscle weakness (generalized): Secondary | ICD-10-CM | POA: Diagnosis not present

## 2016-04-26 DIAGNOSIS — M25552 Pain in left hip: Secondary | ICD-10-CM | POA: Diagnosis not present

## 2016-04-26 DIAGNOSIS — R2681 Unsteadiness on feet: Secondary | ICD-10-CM | POA: Diagnosis not present

## 2016-04-30 LAB — CBC AND DIFFERENTIAL
HEMATOCRIT: 30 % — AB (ref 36–46)
HEMOGLOBIN: 9.5 g/dL — AB (ref 12.0–16.0)
PLATELETS: 450 10*3/uL — AB (ref 150–399)
WBC: 10.5 10^3/mL

## 2016-05-01 ENCOUNTER — Ambulatory Visit: Payer: Commercial Managed Care - HMO | Admitting: Podiatry

## 2016-05-01 DIAGNOSIS — M6281 Muscle weakness (generalized): Secondary | ICD-10-CM | POA: Diagnosis not present

## 2016-05-01 DIAGNOSIS — M25552 Pain in left hip: Secondary | ICD-10-CM | POA: Diagnosis not present

## 2016-05-01 DIAGNOSIS — R2681 Unsteadiness on feet: Secondary | ICD-10-CM | POA: Diagnosis not present

## 2016-05-01 DIAGNOSIS — R262 Difficulty in walking, not elsewhere classified: Secondary | ICD-10-CM | POA: Diagnosis not present

## 2016-05-03 ENCOUNTER — Encounter (INDEPENDENT_AMBULATORY_CARE_PROVIDER_SITE_OTHER): Payer: Self-pay | Admitting: Orthopaedic Surgery

## 2016-05-03 ENCOUNTER — Ambulatory Visit (INDEPENDENT_AMBULATORY_CARE_PROVIDER_SITE_OTHER): Payer: Commercial Managed Care - HMO | Admitting: Orthopaedic Surgery

## 2016-05-03 DIAGNOSIS — Z96642 Presence of left artificial hip joint: Secondary | ICD-10-CM

## 2016-05-03 DIAGNOSIS — M1612 Unilateral primary osteoarthritis, left hip: Secondary | ICD-10-CM

## 2016-05-03 NOTE — Progress Notes (Signed)
Patient is 2 weeks status post left total hip replacement. She is to be discharged from her skilled nursing facility on Saturday. She is doing well. She denies any real complaints. Physical exam shows a well-healed incision. She's ambulate with a walker. She is doing physical therapy and then will have home health physical therapy. Patient is doing well for her postop visit. Neosporin to the incision once a day. Follow-up in 4 weeks for 6 week recheck with standing AP pelvis

## 2016-05-04 ENCOUNTER — Non-Acute Institutional Stay (SKILLED_NURSING_FACILITY): Payer: Commercial Managed Care - HMO | Admitting: Adult Health

## 2016-05-04 ENCOUNTER — Encounter: Payer: Self-pay | Admitting: Adult Health

## 2016-05-04 DIAGNOSIS — E1169 Type 2 diabetes mellitus with other specified complication: Secondary | ICD-10-CM | POA: Diagnosis not present

## 2016-05-04 DIAGNOSIS — E669 Obesity, unspecified: Secondary | ICD-10-CM | POA: Diagnosis not present

## 2016-05-04 DIAGNOSIS — E785 Hyperlipidemia, unspecified: Secondary | ICD-10-CM

## 2016-05-04 DIAGNOSIS — R2681 Unsteadiness on feet: Secondary | ICD-10-CM

## 2016-05-04 DIAGNOSIS — K219 Gastro-esophageal reflux disease without esophagitis: Secondary | ICD-10-CM

## 2016-05-04 DIAGNOSIS — I10 Essential (primary) hypertension: Secondary | ICD-10-CM

## 2016-05-04 DIAGNOSIS — J309 Allergic rhinitis, unspecified: Secondary | ICD-10-CM | POA: Diagnosis not present

## 2016-05-04 DIAGNOSIS — M1612 Unilateral primary osteoarthritis, left hip: Secondary | ICD-10-CM

## 2016-05-04 DIAGNOSIS — R262 Difficulty in walking, not elsewhere classified: Secondary | ICD-10-CM | POA: Diagnosis not present

## 2016-05-04 DIAGNOSIS — M6281 Muscle weakness (generalized): Secondary | ICD-10-CM | POA: Diagnosis not present

## 2016-05-04 DIAGNOSIS — D62 Acute posthemorrhagic anemia: Secondary | ICD-10-CM

## 2016-05-04 DIAGNOSIS — M25552 Pain in left hip: Secondary | ICD-10-CM | POA: Diagnosis not present

## 2016-05-04 DIAGNOSIS — K5901 Slow transit constipation: Secondary | ICD-10-CM

## 2016-05-04 NOTE — Progress Notes (Signed)
Patient ID: Linda Lawson, female   DOB: Sep 13, 1935, 80 y.o.   MRN: [id number]    DATE:  05/04/16  MRN:  [id number]  BIRTHDAY: Sep 13, 1935  Facility:  Nursing Home Location:  [facility]  Nursing Home Room Number: 1203-P  LEVEL OF CARE:  SNF [phone])  Contact Information    Name Relation Home Work HarrimanMobile   Holthaus,Angela Daughter [phone]     Danley Dankerarler,Calvin Son [phone]         Code Status History    Date Active Date Inactive Code Status Order ID Comments User Context   04/19/2016  4:25 PM 04/21/2016  6:59 PM Full Code [id number]  Tarry KosNaiping M Xu, MD Inpatient       Chief Complaint  Patient presents with  . Discharge Note    HISTORY OF PRESENT ILLNESS:  This is is a 80 year old female who is for discharge home With home health PT and OT.  [facility] from [facility]/19/16-04/21/16.She has PMH of aortic stenosis S/P AVR (tissue) 2010, depression, type 2 diabetes mellitus, gout, hyperlipidemia and hypertension. She has primary osteoarthritis of left hip for which she had left total hip arthroplasty on 04/19/16.  Patient was admitted to this facility for short-term rehabilitation after the patient's recent hospitalization.  Patient has completed SNF rehabilitation and therapy has cleared the patient for discharge.   PAST MEDICAL HISTORY:  Past Medical History:  Diagnosis Date  . Aortic stenosis    s/p AVR (tissue) 2010  . Arthritis   . Complication of anesthesia   . Depression    situatinal  . Diabetes mellitus without complication (HCC)    Type II  . Dyspnea    with exertion  . Gout   . Hip joint replacement status   . Hyperlipidemia   . Hypertension   . Pneumonia 2010  . PONV (postoperative nausea and vomiting)   . Primary osteoarthritis of left hip      CURRENT MEDICATIONS: Reviewed  Patient's Medications  New Prescriptions   No medications on file  Previous Medications    ACCU-CHEK AVIVA PLUS TEST STRIP       ACCU-CHEK SOFTCLIX LANCETS LANCETS       ALLOPURINOL (ZYLOPRIM) 100 MG TABLET    Take 100 mg by mouth daily.    ASPIRIN EC 325 MG TABLET    Take 1 tablet (325 mg total) by mouth 2 (two) times daily.   ATORVASTATIN (LIPITOR) 10 MG TABLET    Take 10 mg by mouth at bedtime.    B-D UF III MINI PEN NEEDLES 31G X 5 MM MISC       GUAIFENESIN (ROBITUSSIN) 100 MG/5ML LIQUID    Take 200 mg by mouth 4 (four) times daily. Take x2 days and notify MD/NP if no improvement for productive cough   HYDROMORPHONE (DILAUDID) 2 MG TABLET    Take 1 tablet (2 mg total) by mouth every 4 (four) hours as needed for severe pain.   LANTUS SOLOSTAR 100 UNIT/ML SOLOSTAR PEN    Inject 10 Units into the skin daily.    LISINOPRIL (PRINIVIL,ZESTRIL) 40 MG TABLET    Take 40 mg by mouth daily.    LORATADINE (CLARITIN) 10 MG TABLET    Take 10 mg by mouth daily as needed for allergies or rhinitis.   METHOCARBAMOL (ROBAXIN) 500 MG TABLET    Take 1 tablet (500 mg total) by mouth every 6 (six) hours as needed  for muscle spasms.   ONDANSETRON (ZOFRAN) 4 MG TABLET    Take 1-2 tablets (4-8 mg total) by mouth every 8 (eight) hours as needed for nausea or vomiting.   PROMETHAZINE (PHENERGAN) 25 MG TABLET    Take 1 tablet (25 mg total) by mouth every 6 (six) hours as needed for nausea.   RANITIDINE (ZANTAC) 150 MG TABLET    Take 150 mg by mouth daily.   SENNOSIDES-DOCUSATE SODIUM (SENOKOT-S) 8.6-50 MG TABLET    Take 2 tablets by mouth at bedtime.   TRADJENTA 5 MG TABS TABLET    Take 5 mg by mouth daily with breakfast.   Modified Medications   No medications on file  Discontinued Medications   No medications on file     Allergies  Allergen Reactions  . Penicillins Swelling and Rash    Has patient had a PCN reaction causing immediate rash, facial/tongue/throat swelling, SOB or lightheadedness with hypotension: Yes Has patient had a PCN reaction causing severe rash involving mucus membranes or skin  necrosis: No Has patient had a PCN reaction that required hospitalization: No Has patient had a PCN reaction occurring within the last 10 years: No If all of the above answers are "NO", then may proceed with Cephalosporin use.   . Codeine Nausea And Vomiting     REVIEW OF SYSTEMS:  GENERAL: no change in appetite, no fatigue, no weight changes, no fever, chills or weakness EYES: Denies change in vision, dry eyes, eye pain, itching or discharge EARS: Denies change in hearing, ringing in ears, or earache NOSE: Denies nasal congestion or epistaxis MOUTH and THROAT: Denies oral discomfort, gingival pain or bleeding, pain from teeth or hoarseness   RESPIRATORY: no cough, SOB, DOE, wheezing, hemoptysis CARDIAC: no chest pain, edema or palpitations GI: no abdominal pain, diarrhea, heart burn, nausea or vomiting GU: Denies dysuria, frequency, hematuria, incontinence, or discharge PSYCHIATRIC: Denies feeling of depression or anxiety. No report of hallucinations, insomnia, paranoia, or agitation    PHYSICAL EXAMINATION  GENERAL APPEARANCE: Well nourished. In no acute distress. Obese SKIN:  Left hip surgical incision is dry and no erythema HEAD: Normal in size and contour. No evidence of trauma EYES: Lids open and close normally. No blepharitis, entropion or ectropion. PERRL. Conjunctivae are clear and sclerae are white. Lenses are without opacity EARS: Pinnae are normal. Patient hears normal voice tunes of the examiner MOUTH and THROAT: Lips are without lesions. Oral mucosa is moist and without lesions. Tongue is normal in shape, size, and color and without lesions NECK: supple, trachea midline, no neck masses, no thyroid tenderness, no thyromegaly LYMPHATICS: no LAN in the neck, no supraclavicular LAN RESPIRATORY: breathing is even & unlabored, BS CTAB CARDIAC: RRR, + murmur,no extra heart sounds, LLE 2+edema GI: abdomen soft, normal BS, no masses, no tenderness, no hepatomegaly, no  splenomegaly EXTREMITIES:  Able to move 4 extremities PSYCHIATRIC: Alert and oriented X 3. Affect and behavior are appropriate  LABS/RADIOLOGY: Labs reviewed: Basic Metabolic Panel:  Recent Labs  96/08/5410/05/17 1314 04/20/16 0442 04/24/16  NA 138 138 140  K 4.3 4.4 4.5  CL 108 107  --   CO2 25 23  --   GLUCOSE 129* 168*  --   BUN 14 12 23*  CREATININE 0.97 1.03* 0.9  CALCIUM 9.3 8.7*  --    Liver Function Tests:  Recent Labs  04/17/16 1314  AST 22  ALT 18  ALKPHOS 76  BILITOT 0.4  PROT 6.6  ALBUMIN 3.7   CBC:  Recent Labs  04/17/16 1314 04/20/16 0442 04/24/16 04/30/16  WBC 8.6 13.6* 13.7 10.5  NEUTROABS 5.5  --  9  --   HGB 10.4* 9.3* 9.9* 9.5*  HCT 33.0* 29.0* 31* 30*  MCV 75.7* 75.7*  --   --   PLT 276 246 314 450*   CBG:    Recent Labs  04/20/16 2111 04/21/16 0615 04/21/16 1147  GLUCAP 215* 137* 164*     Dg Pelvis Portable  Result Date: 04/19/2016 CLINICAL DATA:  Post left hip replacement EXAM: PORTABLE PELVIS 1-2 VIEWS COMPARISON:  The pelvis and left hip films of 10/24/2015 FINDINGS: Previous right total hip replacement components are unchanged in position. A new left total hip replacement is present with acetabular and femoral components in good position. No complicating features are seen. Some air is noted in the adjacent soft tissues of the left hip post operatively. The pelvic rami are intact. IMPRESSION: Left hip replacement components in good position. No complicating features pre Electronically Signed   By: Dwyane Dee M.D.   On: 04/19/2016 15:54   Dg C-arm 1-60 Min  Result Date: 04/19/2016 CLINICAL DATA:  Left total hip replacement EXAM: DG C-ARM 61-120 MIN COMPARISON:  Pelvis on left hip films of 10/24/2015 FINDINGS: C-arm fluoroscopy was provided during left hip replacement. Fluoroscopy time of 37 seconds was recorded. IMPRESSION: C-arm fluoroscopy provided. Electronically Signed   By: Dwyane Dee M.D.   On: 04/19/2016 14:44   Dg Hip  Operative Unilat With Pelvis Left  Result Date: 04/19/2016 CLINICAL DATA:  Left total hip replacement EXAM: OPERATIVE left HIP (WITH PELVIS IF PERFORMED) one VIEW TECHNIQUE: Fluoroscopic spot image(s) were submitted for interpretation post-operatively. COMPARISON:  Pelvis and left hip films of 10/24/2015 FINDINGS: A single C-arm spot film shows the acetabular and femoral components of the left hip replacement to be in good position. No fracture is seen. IMPRESSION: Left total hip replacement components in good position with no complicating features. Electronically Signed   By: Dwyane Dee M.D.   On: 04/19/2016 14:45    ASSESSMENT/PLAN:  Unsteady gait - for Home health PT and OT, for therapeutic strengthening exercises; fall precaution  Osteoarthritis of left hip S/P left total hip arthroplasty - for Home health PT and OT, for therapeutic strengthening exercises; continue aspirin EC 325 mg 1 tab by mouth twice a day for DVT prophylaxis; Dilaudid 2 mg 1 tab by mouth every 4 hours when necessary for pain and discontinue Mobic; Robaxin 500 mg 1 tab by mouth every 6 hours when necessary for muscle spasm; follow-up with orthopedic surgeon, Dr. Roda Shutters  Diabetes mellitus, type II - continue Lantus 10 units subcutaneous daily and Tradjenta 5 mg 1 tab by mouth daily Lab Results  Component Value Date   HGBA1C 7.9 (H) 04/17/2016   Hypertension - well-controlled; continue lisinopril 40 mg 1 tab by mouth daily  Allergic rhinitis - continue Claritin 10 mg 1 tab by mouth daily when necessary  Hyperlipidemia - continue Lipitor 10 mg 1 tab by mouth daily at bedtime  Constipation - continue senna S 2 tabs by mouth daily at bedtime  Leukocytosis - resolved Lab Results  Component Value Date   WBC 10.5 04/30/2016    Anemia, acute blood loss - stable Lab Results  Component Value Date   HGB 9.5 (A) 04/30/2016   GERD - continue Ranitidine 150 mg 1 tab PO Q D      I have filled out patient's discharge  paperwork and written prescriptions.  Patient will receive home health PT and OT.  DME provided:  None  Total discharge time: Greater than 30 minutes Greater than 50% was spent in counseling and coordination of care with the patient.   Discharge time involved coordination of the discharge process with social worker, nursing staff and therapy department. Medical justification for home health services verified.    Monina C. Medina-Vargas  - NP [employer] [phone]

## 2016-05-06 DIAGNOSIS — R2689 Other abnormalities of gait and mobility: Secondary | ICD-10-CM | POA: Diagnosis not present

## 2016-05-06 DIAGNOSIS — F329 Major depressive disorder, single episode, unspecified: Secondary | ICD-10-CM | POA: Diagnosis not present

## 2016-05-06 DIAGNOSIS — Z471 Aftercare following joint replacement surgery: Secondary | ICD-10-CM | POA: Diagnosis not present

## 2016-05-06 DIAGNOSIS — I10 Essential (primary) hypertension: Secondary | ICD-10-CM | POA: Diagnosis not present

## 2016-05-06 DIAGNOSIS — Z794 Long term (current) use of insulin: Secondary | ICD-10-CM | POA: Diagnosis not present

## 2016-05-06 DIAGNOSIS — E119 Type 2 diabetes mellitus without complications: Secondary | ICD-10-CM | POA: Diagnosis not present

## 2016-05-06 DIAGNOSIS — Z96642 Presence of left artificial hip joint: Secondary | ICD-10-CM | POA: Diagnosis not present

## 2016-05-11 DIAGNOSIS — E119 Type 2 diabetes mellitus without complications: Secondary | ICD-10-CM | POA: Diagnosis not present

## 2016-05-11 DIAGNOSIS — Z471 Aftercare following joint replacement surgery: Secondary | ICD-10-CM | POA: Diagnosis not present

## 2016-05-11 DIAGNOSIS — Z96642 Presence of left artificial hip joint: Secondary | ICD-10-CM | POA: Diagnosis not present

## 2016-05-11 DIAGNOSIS — I10 Essential (primary) hypertension: Secondary | ICD-10-CM | POA: Diagnosis not present

## 2016-05-11 DIAGNOSIS — R2689 Other abnormalities of gait and mobility: Secondary | ICD-10-CM | POA: Diagnosis not present

## 2016-05-11 DIAGNOSIS — F329 Major depressive disorder, single episode, unspecified: Secondary | ICD-10-CM | POA: Diagnosis not present

## 2016-05-11 DIAGNOSIS — Z794 Long term (current) use of insulin: Secondary | ICD-10-CM | POA: Diagnosis not present

## 2016-05-15 DIAGNOSIS — I10 Essential (primary) hypertension: Secondary | ICD-10-CM | POA: Diagnosis not present

## 2016-05-15 DIAGNOSIS — E119 Type 2 diabetes mellitus without complications: Secondary | ICD-10-CM | POA: Diagnosis not present

## 2016-05-15 DIAGNOSIS — F329 Major depressive disorder, single episode, unspecified: Secondary | ICD-10-CM | POA: Diagnosis not present

## 2016-05-15 DIAGNOSIS — M1712 Unilateral primary osteoarthritis, left knee: Secondary | ICD-10-CM | POA: Diagnosis not present

## 2016-05-15 DIAGNOSIS — R2689 Other abnormalities of gait and mobility: Secondary | ICD-10-CM | POA: Diagnosis not present

## 2016-05-15 DIAGNOSIS — Z96642 Presence of left artificial hip joint: Secondary | ICD-10-CM | POA: Diagnosis not present

## 2016-05-15 DIAGNOSIS — Z794 Long term (current) use of insulin: Secondary | ICD-10-CM | POA: Diagnosis not present

## 2016-05-15 DIAGNOSIS — Z471 Aftercare following joint replacement surgery: Secondary | ICD-10-CM | POA: Diagnosis not present

## 2016-05-17 DIAGNOSIS — Z471 Aftercare following joint replacement surgery: Secondary | ICD-10-CM | POA: Diagnosis not present

## 2016-05-17 DIAGNOSIS — R2689 Other abnormalities of gait and mobility: Secondary | ICD-10-CM | POA: Diagnosis not present

## 2016-05-17 DIAGNOSIS — Z794 Long term (current) use of insulin: Secondary | ICD-10-CM | POA: Diagnosis not present

## 2016-05-17 DIAGNOSIS — Z96642 Presence of left artificial hip joint: Secondary | ICD-10-CM | POA: Diagnosis not present

## 2016-05-17 DIAGNOSIS — I10 Essential (primary) hypertension: Secondary | ICD-10-CM | POA: Diagnosis not present

## 2016-05-17 DIAGNOSIS — E119 Type 2 diabetes mellitus without complications: Secondary | ICD-10-CM | POA: Diagnosis not present

## 2016-05-17 DIAGNOSIS — F329 Major depressive disorder, single episode, unspecified: Secondary | ICD-10-CM | POA: Diagnosis not present

## 2016-05-18 DIAGNOSIS — E119 Type 2 diabetes mellitus without complications: Secondary | ICD-10-CM | POA: Diagnosis not present

## 2016-05-18 DIAGNOSIS — Z794 Long term (current) use of insulin: Secondary | ICD-10-CM | POA: Diagnosis not present

## 2016-05-18 DIAGNOSIS — R2689 Other abnormalities of gait and mobility: Secondary | ICD-10-CM | POA: Diagnosis not present

## 2016-05-18 DIAGNOSIS — I10 Essential (primary) hypertension: Secondary | ICD-10-CM | POA: Diagnosis not present

## 2016-05-18 DIAGNOSIS — F329 Major depressive disorder, single episode, unspecified: Secondary | ICD-10-CM | POA: Diagnosis not present

## 2016-05-18 DIAGNOSIS — Z471 Aftercare following joint replacement surgery: Secondary | ICD-10-CM | POA: Diagnosis not present

## 2016-05-18 DIAGNOSIS — Z96642 Presence of left artificial hip joint: Secondary | ICD-10-CM | POA: Diagnosis not present

## 2016-05-23 DIAGNOSIS — Z794 Long term (current) use of insulin: Secondary | ICD-10-CM | POA: Diagnosis not present

## 2016-05-23 DIAGNOSIS — E119 Type 2 diabetes mellitus without complications: Secondary | ICD-10-CM | POA: Diagnosis not present

## 2016-05-23 DIAGNOSIS — R2689 Other abnormalities of gait and mobility: Secondary | ICD-10-CM | POA: Diagnosis not present

## 2016-05-23 DIAGNOSIS — Z96642 Presence of left artificial hip joint: Secondary | ICD-10-CM | POA: Diagnosis not present

## 2016-05-23 DIAGNOSIS — I10 Essential (primary) hypertension: Secondary | ICD-10-CM | POA: Diagnosis not present

## 2016-05-23 DIAGNOSIS — Z471 Aftercare following joint replacement surgery: Secondary | ICD-10-CM | POA: Diagnosis not present

## 2016-05-23 DIAGNOSIS — F329 Major depressive disorder, single episode, unspecified: Secondary | ICD-10-CM | POA: Diagnosis not present

## 2016-05-25 DIAGNOSIS — R2689 Other abnormalities of gait and mobility: Secondary | ICD-10-CM | POA: Diagnosis not present

## 2016-05-25 DIAGNOSIS — E119 Type 2 diabetes mellitus without complications: Secondary | ICD-10-CM | POA: Diagnosis not present

## 2016-05-25 DIAGNOSIS — Z96642 Presence of left artificial hip joint: Secondary | ICD-10-CM | POA: Diagnosis not present

## 2016-05-25 DIAGNOSIS — Z471 Aftercare following joint replacement surgery: Secondary | ICD-10-CM | POA: Diagnosis not present

## 2016-05-25 DIAGNOSIS — I10 Essential (primary) hypertension: Secondary | ICD-10-CM | POA: Diagnosis not present

## 2016-05-25 DIAGNOSIS — F329 Major depressive disorder, single episode, unspecified: Secondary | ICD-10-CM | POA: Diagnosis not present

## 2016-05-25 DIAGNOSIS — Z794 Long term (current) use of insulin: Secondary | ICD-10-CM | POA: Diagnosis not present

## 2016-05-31 ENCOUNTER — Ambulatory Visit (INDEPENDENT_AMBULATORY_CARE_PROVIDER_SITE_OTHER): Payer: Commercial Managed Care - HMO | Admitting: Orthopaedic Surgery

## 2016-06-02 DIAGNOSIS — Z471 Aftercare following joint replacement surgery: Secondary | ICD-10-CM | POA: Diagnosis not present

## 2016-06-02 DIAGNOSIS — F329 Major depressive disorder, single episode, unspecified: Secondary | ICD-10-CM | POA: Diagnosis not present

## 2016-06-02 DIAGNOSIS — Z96642 Presence of left artificial hip joint: Secondary | ICD-10-CM | POA: Diagnosis not present

## 2016-06-02 DIAGNOSIS — R2689 Other abnormalities of gait and mobility: Secondary | ICD-10-CM | POA: Diagnosis not present

## 2016-06-02 DIAGNOSIS — Z794 Long term (current) use of insulin: Secondary | ICD-10-CM | POA: Diagnosis not present

## 2016-06-02 DIAGNOSIS — E119 Type 2 diabetes mellitus without complications: Secondary | ICD-10-CM | POA: Diagnosis not present

## 2016-06-02 DIAGNOSIS — I10 Essential (primary) hypertension: Secondary | ICD-10-CM | POA: Diagnosis not present

## 2016-06-05 ENCOUNTER — Ambulatory Visit (INDEPENDENT_AMBULATORY_CARE_PROVIDER_SITE_OTHER): Payer: Commercial Managed Care - HMO | Admitting: Orthopaedic Surgery

## 2016-06-05 ENCOUNTER — Encounter (INDEPENDENT_AMBULATORY_CARE_PROVIDER_SITE_OTHER): Payer: Self-pay | Admitting: Orthopaedic Surgery

## 2016-06-05 ENCOUNTER — Ambulatory Visit (INDEPENDENT_AMBULATORY_CARE_PROVIDER_SITE_OTHER): Payer: Commercial Managed Care - HMO

## 2016-06-05 ENCOUNTER — Encounter (INDEPENDENT_AMBULATORY_CARE_PROVIDER_SITE_OTHER): Payer: Self-pay

## 2016-06-05 DIAGNOSIS — M1612 Unilateral primary osteoarthritis, left hip: Secondary | ICD-10-CM | POA: Diagnosis not present

## 2016-06-05 NOTE — Progress Notes (Signed)
Patient is 6 weeks status post left total hip replacement. She is doing well. She is occasionally using a cane. She is very happy with her progress. Incision is nicely healed no signs of infection. X-ray show stable total hip replacement. I'll see her back in 2 months. No x-rays needed and she is doing well. Reminded her of dental prophylaxis. Increase activity as tolerated.

## 2016-06-06 DIAGNOSIS — R2689 Other abnormalities of gait and mobility: Secondary | ICD-10-CM | POA: Diagnosis not present

## 2016-06-06 DIAGNOSIS — Z794 Long term (current) use of insulin: Secondary | ICD-10-CM | POA: Diagnosis not present

## 2016-06-06 DIAGNOSIS — E119 Type 2 diabetes mellitus without complications: Secondary | ICD-10-CM | POA: Diagnosis not present

## 2016-06-06 DIAGNOSIS — F329 Major depressive disorder, single episode, unspecified: Secondary | ICD-10-CM | POA: Diagnosis not present

## 2016-06-06 DIAGNOSIS — Z96642 Presence of left artificial hip joint: Secondary | ICD-10-CM | POA: Diagnosis not present

## 2016-06-06 DIAGNOSIS — Z471 Aftercare following joint replacement surgery: Secondary | ICD-10-CM | POA: Diagnosis not present

## 2016-06-06 DIAGNOSIS — I10 Essential (primary) hypertension: Secondary | ICD-10-CM | POA: Diagnosis not present

## 2016-06-08 ENCOUNTER — Telehealth (INDEPENDENT_AMBULATORY_CARE_PROVIDER_SITE_OTHER): Payer: Self-pay | Admitting: Orthopaedic Surgery

## 2016-06-08 NOTE — Telephone Encounter (Signed)
Please advise 

## 2016-06-08 NOTE — Telephone Encounter (Signed)
Patient wants to know if she can go to the gym

## 2016-06-08 NOTE — Telephone Encounter (Signed)
yes

## 2016-06-11 NOTE — Telephone Encounter (Signed)
Called pt to advise

## 2016-06-27 DIAGNOSIS — N189 Chronic kidney disease, unspecified: Secondary | ICD-10-CM | POA: Diagnosis not present

## 2016-06-27 DIAGNOSIS — E119 Type 2 diabetes mellitus without complications: Secondary | ICD-10-CM | POA: Diagnosis not present

## 2016-06-27 DIAGNOSIS — I10 Essential (primary) hypertension: Secondary | ICD-10-CM | POA: Diagnosis not present

## 2016-06-27 DIAGNOSIS — E782 Mixed hyperlipidemia: Secondary | ICD-10-CM | POA: Diagnosis not present

## 2016-07-11 DIAGNOSIS — E119 Type 2 diabetes mellitus without complications: Secondary | ICD-10-CM | POA: Diagnosis not present

## 2016-07-11 DIAGNOSIS — I10 Essential (primary) hypertension: Secondary | ICD-10-CM | POA: Diagnosis not present

## 2016-07-11 DIAGNOSIS — N19 Unspecified kidney failure: Secondary | ICD-10-CM | POA: Diagnosis not present

## 2016-07-13 DIAGNOSIS — I10 Essential (primary) hypertension: Secondary | ICD-10-CM | POA: Diagnosis not present

## 2016-07-13 DIAGNOSIS — Z6832 Body mass index (BMI) 32.0-32.9, adult: Secondary | ICD-10-CM | POA: Diagnosis not present

## 2016-07-13 DIAGNOSIS — N189 Chronic kidney disease, unspecified: Secondary | ICD-10-CM | POA: Diagnosis not present

## 2016-07-13 DIAGNOSIS — L918 Other hypertrophic disorders of the skin: Secondary | ICD-10-CM | POA: Diagnosis not present

## 2016-07-13 DIAGNOSIS — E119 Type 2 diabetes mellitus without complications: Secondary | ICD-10-CM | POA: Diagnosis not present

## 2016-08-06 ENCOUNTER — Encounter (INDEPENDENT_AMBULATORY_CARE_PROVIDER_SITE_OTHER): Payer: Self-pay | Admitting: Orthopaedic Surgery

## 2016-08-06 ENCOUNTER — Ambulatory Visit (INDEPENDENT_AMBULATORY_CARE_PROVIDER_SITE_OTHER): Payer: Medicare HMO | Admitting: Orthopaedic Surgery

## 2016-08-06 DIAGNOSIS — Z96642 Presence of left artificial hip joint: Secondary | ICD-10-CM | POA: Diagnosis not present

## 2016-08-06 NOTE — Progress Notes (Signed)
Office Visit Note   Patient: Linda Lawson           Date of Birth: 1936-01-20           MRN: [id number] Visit Date: 08/06/2016              Requested by: Lewis MoccasinElizabeth R Dewey, MD [address] [place], [place] [id number] PCP: Maryelizabeth RowanEWEY,ELIZABETH, MD   Assessment & Plan: Visit Diagnoses:  1. Status post left hip replacement     Plan: 3 month THA follow up plan  Patient now 3 months status post total hip arthroplasty. Wound is healed with no signs of complications or infection.  The patient does not complain of pain, and is back to normal daily activities. It was reinforced that prophylactic antibiotics should be taken with any procedure including but not limited to dental work or colonoscopies.  We will plan on following up at the 12 month postop visit with radiographs at that time. As always, instructions were given to call with any questions or concerns in the interim.   Follow-Up Instructions: Return in about 9 months (around 05/08/2017).   Orders:  No orders of the defined types were placed in this encounter.  No orders of the defined types were placed in this encounter.     Procedures: No procedures performed   Clinical Data: No additional findings.   Subjective: Chief Complaint  Patient presents with  . Left Hip - Follow-up    Patient is a little over 3 months status post left total hip replacement. She is doing well. He complains of some occasional burning pain in the hip.    Review of Systems   Objective: Vital Signs: There were no vitals taken for this visit.  Physical Exam  Ortho Exam left hip exam shows well-healed surgical scar. Painless range of motion. Ambulating without assistive devices Specialty Comments:  No specialty comments available.  Imaging: No results found.   PMFS History: Patient Active Problem List   Diagnosis Date Noted  . Hip joint replacement status 04/19/2016  . Primary osteoarthritis of left hip    Past Medical  History:  Diagnosis Date  . Aortic stenosis    s/p AVR (tissue) 2010  . Arthritis   . Complication of anesthesia   . Depression    situatinal  . Diabetes mellitus without complication (HCC)    Type II  . Dyspnea    with exertion  . Gout   . Hip joint replacement status   . Hyperlipidemia   . Hypertension   . Pneumonia 2010  . PONV (postoperative nausea and vomiting)   . Primary osteoarthritis of left hip     No family history on file.  Past Surgical History:  Procedure Laterality Date  . ABDOMINAL HYSTERECTOMY    . COLONOSCOPY    . HIP ARTHROPLASTY Right 2002  . JOINT REPLACEMENT    . KNEE ARTHROPLASTY Left 2012  . pericardial tissue heart valve  2010   AVR  . TOTAL HIP ARTHROPLASTY Left 04/19/2016  . TOTAL HIP ARTHROPLASTY Left 04/19/2016   Procedure: LEFT TOTAL HIP ARTHROPLASTY ANTERIOR APPROACH;  Surgeon: Tarry KosNaiping M Jahmeir Geisen, MD;  Location: [facility] OR;  Service: Orthopedics;  Laterality: Left;   Social History   Occupational History  . Not on file.   Social History Main Topics  . Smoking status: Former Smoker    Years: 20.00    Quit date: 06/12/2008  . Smokeless tobacco: Never Used  . Alcohol use No  .  Drug use: No  . Sexual activity: Not on file       

## 2016-08-22 DIAGNOSIS — R252 Cramp and spasm: Secondary | ICD-10-CM | POA: Diagnosis not present

## 2016-08-22 DIAGNOSIS — R001 Bradycardia, unspecified: Secondary | ICD-10-CM | POA: Diagnosis not present

## 2016-08-22 DIAGNOSIS — I10 Essential (primary) hypertension: Secondary | ICD-10-CM | POA: Diagnosis not present

## 2016-08-22 DIAGNOSIS — Z6832 Body mass index (BMI) 32.0-32.9, adult: Secondary | ICD-10-CM | POA: Diagnosis not present

## 2016-08-22 DIAGNOSIS — I491 Atrial premature depolarization: Secondary | ICD-10-CM | POA: Diagnosis not present

## 2016-08-22 DIAGNOSIS — R0602 Shortness of breath: Secondary | ICD-10-CM | POA: Diagnosis not present

## 2016-08-24 ENCOUNTER — Ambulatory Visit (INDEPENDENT_AMBULATORY_CARE_PROVIDER_SITE_OTHER): Payer: Medicare HMO | Admitting: Cardiology

## 2016-08-24 ENCOUNTER — Encounter: Payer: Self-pay | Admitting: Cardiology

## 2016-08-24 VITALS — BP 183/91 | HR 64 | Ht 64.0 in | Wt 185.0 lb

## 2016-08-24 DIAGNOSIS — Z953 Presence of xenogenic heart valve: Secondary | ICD-10-CM | POA: Diagnosis not present

## 2016-08-24 DIAGNOSIS — R9431 Abnormal electrocardiogram [ECG] [EKG]: Secondary | ICD-10-CM

## 2016-08-24 DIAGNOSIS — I10 Essential (primary) hypertension: Secondary | ICD-10-CM | POA: Diagnosis not present

## 2016-08-24 DIAGNOSIS — I491 Atrial premature depolarization: Secondary | ICD-10-CM

## 2016-08-24 HISTORY — DX: Atrial premature depolarization: I49.1

## 2016-08-24 NOTE — Patient Instructions (Signed)
NO CHANGE WITH CURRENT TREATMENT   FOLLOW UP WITH REGULAR CARDIOLOGIST- DR CHEEK    Your physician recommends that you schedule a follow-up appointment ON AS NEEDED BASIS

## 2016-08-24 NOTE — Progress Notes (Signed)
PCP: Maryelizabeth Rowan, MD  Clinic Note: Chief Complaint  Patient presents with  . New Patient (Initial Visit)    Cardiology Referral. History of AVR    HPI: Linda Lawson is a 81 y.o. female with a PMH below who presents today for EVALUATION OF heart palpitations. She has a history of bioprosthetic aortic valve replacement. She has been followed by Dr. Beverely Pace. He also has many PACs  Bioprosthetic aortic valve replacement 2010  Preop showed severe stenosis, but no significant CAD. 2010  2-D echocardiogram 2016 showed prosthetic valve function.  Linda Lawson was last seen on October 19 by Dr. Beverely Pace for preoperative evaluation for hip surgery. She was noted to have bilateral hip discomfort and had arthroplasty done last December. She that time denied any exertional angina, dizziness or dyspnea. She is feeling well. She had a follow-up echocardiogram in 2016 that showed normal valve function with no AI.  Recent Hospitalizations: n/a  Studies Reviewed:   EKG from PCP: Sinus bradycardia with frequent PACs (a normal trigeminal pattern. Cannot exclude septal infarct, age indeterminate. Market ST and T-wave abnormality, consider anterolateral ischemia. --> Per review from her primary cardiologist knows, she has had known PACs, and no T-wave inversions. Unfortunately I do not have images for those EKGs.  Significant history was obtained via Care Everywhere as well as well as her PCPs notes.  Interval History: Linda Lawson presents here today somewhat confused because she is not sure why she is seeing another cardiologist. She is referred for frequent PACs and bradycardia/abnormal EKG, but has not had any cardiac symptoms to speak of. She denies any significant symptoms of chest tightness or pressure with rest or exertion. She gets short of breath when she walks quickly up a flight of steps or up hill, but that has been long-standing. Has a little bit of leg aching at night which is not  consistent with claudication because is not exacerbated with walking. Size 4 times a week without any significant exertional chest pain or dyspnea. Slight she has mild occasional swelling but no PND or orthopnea. She does occasionally feel her heart skipping beats off and on, but denies any sensation of rapid heartbeats or palpitations. She's not had any near-syncope or syncopal type symptoms associated with it. She really hasn't thought about it at all. She indicates that her primary cardiologist is not mentioning to her about it. As far she can recall, she has never been told she had any abnormalities on cardiac evaluation other than having to have her valve surgery done. Her preop catheterization did not show any CAD.   No TIA/amaurosis fugax symptoms. No melena, hematochezia, hematuria, or epstaxis. No claudication.  ROS: A comprehensive was performed. Review of Systems  Constitutional: Negative for malaise/fatigue.  Respiratory: Negative for shortness of breath (Only if she goes fast uphill, or upstairs).   Cardiovascular: Negative.        Per history of present illness  Musculoskeletal:       Sometimes her legs cramp at night  Neurological: Negative.   Endo/Heme/Allergies: Negative for environmental allergies.  Psychiatric/Behavioral: Negative.  Negative for memory loss. The patient is not nervous/anxious and does not have insomnia.   All other systems reviewed and are negative.   Past Medical History:  Diagnosis Date  . Arthritis   . Complication of anesthesia   . Depression    situatinal  . Diabetes mellitus without complication (HCC)    Type II  . Essential hypertension   . Gout   .  Hip joint replacement status   . History of aortic valvular stenosis - s/p AVR 2010   s/p AVR (tissue) 2010; normal valve function by echo in 2016  . Hyperlipidemia   . Pneumonia 2010  . PONV (postoperative nausea and vomiting)   . Premature atrial contractions - asymptomatic 08/24/2016  .  Primary osteoarthritis of left hip   . Status post aortic valve replacement with bioprosthetic valve 08/24/2008    Past Surgical History:  Procedure Laterality Date  . ABDOMINAL HYSTERECTOMY    . COLONOSCOPY    . HIP ARTHROPLASTY Right 2002  . JOINT REPLACEMENT    . KNEE ARTHROPLASTY Left 2012  . pericardial tissue heart valve  2010   AVR  . TOTAL HIP ARTHROPLASTY Left 04/19/2016  . TOTAL HIP ARTHROPLASTY Left 04/19/2016   Procedure: LEFT TOTAL HIP ARTHROPLASTY ANTERIOR APPROACH;  Surgeon: Tarry Kos, MD;  Location: [facility] OR;  Service: Orthopedics;  Laterality: Left;    Current Meds  Medication Sig  . ACCU-CHEK AVIVA PLUS test strip   . ACCU-CHEK SOFTCLIX LANCETS lancets   . allopurinol (ZYLOPRIM) 100 MG tablet Take 100 mg by mouth daily.   Marland Kitchen aspirin EC 325 MG tablet Take 1 tablet (325 mg total) by mouth 2 (two) times daily.  . B-D UF III MINI PEN NEEDLES 31G X 5 MM MISC   . LANTUS SOLOSTAR 100 UNIT/ML Solostar Pen Inject 10 Units into the skin daily.   Marland Kitchen lisinopril (PRINIVIL,ZESTRIL) 40 MG tablet Take 40 mg by mouth daily.   Marland Kitchen loratadine (CLARITIN) 10 MG tablet Take 10 mg by mouth daily as needed for allergies or rhinitis.  . meloxicam (MOBIC) 7.5 MG tablet Take 7.5 mg by mouth daily as needed for pain.  . methocarbamol (ROBAXIN) 500 MG tablet Take 1 tablet (500 mg total) by mouth every 6 (six) hours as needed for muscle spasms.  . TRADJENTA 5 MG TABS tablet Take 5 mg by mouth daily with breakfast.     Allergies  Allergen Reactions  . Penicillins Swelling and Rash    Has patient had a PCN reaction causing immediate rash, facial/tongue/throat swelling, SOB or lightheadedness with hypotension: Yes Has patient had a PCN reaction causing severe rash involving mucus membranes or skin necrosis: No Has patient had a PCN reaction that required hospitalization: No Has patient had a PCN reaction occurring within the last 10 years: No If all of the above answers are "NO", then may proceed  with Cephalosporin use.   . Codeine Nausea And Vomiting    Social History   Social History  . Marital status: Single    Spouse name: N/A  . Number of children: N/A  . Years of education: N/A   Social History Main Topics  . Smoking status: Former Smoker    Years: 20.00    Quit date: 06/12/2008  . Smokeless tobacco: Never Used  . Alcohol use No  . Drug use: No  . Sexual activity: Not Asked   Other Topics Concern  . None   Social History Narrative  . None    family history includes Diabetes in her maternal grandmother and sister; Heart disease in her maternal grandmother.  Wt Readings from Last 3 Encounters:  08/24/16 185 lb (83.9 kg)  05/04/16 187 lb (84.8 kg)  04/24/16 187 lb (84.8 kg)    PHYSICAL EXAM BP (!) 183/91 (BP Location: Right Arm)   Pulse 64   Ht  (1.626 m)   Wt 185 lb (83.9 kg)  BMI 31.76 kg/m  General appearance: alert, cooperative, appears stated age, no distress and Mildly obese. General he appears well-nourished and well-groomed. Pleasant mood and affect. HEENT: [place]/AT, EOMI, MMM, anicteric sclera Neck: no adenopathy, no carotid bruit and no JVD Lungs: clear to auscultation bilaterally, normal percussion bilaterally and non-labored Heart: Regularly irregular rhythm and rate., S1 & S2 normal, no rubs, clicks or gallops. She does have a 2/6 SEM at RUSB radiating to carotids. Abdomen: soft, non-tender; bowel sounds normal; no masses,  no organomegaly; no HJR Extremities: extremities normal, atraumatic, no cyanosis, or edema Pulses: 2+ and symmetric;  Neurologic: Mental status: Alert, oriented, thought content appropriate; Cranial nerves: normal (II-XII grossly intact)   Adult ECG Report n/a  Other studies Reviewed: Additional studies/ records that were reviewed today include:  Recent Labs:  n/a -labs checked by PCP  ASSESSMENT / PLAN: Problem List Items Addressed This Visit    Abnormal electrocardiogram (ECG) (EKG) (Chronic)    In  addition to having PACs and also bigeminal/trigeminal pattern, she does have deep T-wave inversions in the precordial leads. Again, her primary cardiology notes indicate that these T-wave inversions are probably not new. Since I do not have any old EKGs compare to, I would prefer to let her primary cardiologist reviewed these EKGs and see if improving else is new. She is not symptomatic at all, and I doubt that she has coronary ischemia. She had a cardiac catheterization prior to her valve surgery, has not had any anginal or heart failure symptoms.  Defer to primary cardiologist for continued management.      Essential hypertension (Chronic)    Profoundly hypertensive today, but this is something that is being evaluated and treated by her PCP, and probably by her primary cardiologist. Since I will not be following up with her after this visit, I don't think that it would be wise for me to treat. I would avoid AV nodal agents such as beta blockers or non-dihydropyridine calcium channel blockers.  Calcium channel blocker such as amlodipine may be beneficial as they are less likely to affect her heart rate.       Premature atrial contractions - asymptomatic (Chronic)    She does have frequent PACs on her EKG with some baseline bradycardia. She is not on beta blocker. Her last clinic note from her primary cardiologist, Dr. Beverely Pace indicates that she has frequent PACs that are asymptomatic as a chronic diagnosis. It also indicates T-wave inversions that are chronic.  Based on the fact that these findings seem to be chronic, I don't think we need to readdress it any further unless it is done by her primary cardiologist who knows her better. I simply think that she should return to her primary cardiologist as routinely scheduled to discuss these findings.      Status post aortic valve replacement with bioprosthetic valve (Chronic)    Status post bioprosthetic aortic valve replacement with normal sounding  murmur. No heart failure symptoms. I suspect that some EKG changes are resultant from her disease. I don't know when her last echo was, but would defer to her primary cardiologist to evaluate.         Current medicines are reviewed at length with the patient today. (+/- concerns) n/a The following changes have been made: n/a  Patient Instructions  NO CHANGE WITH CURRENT TREATMENT   FOLLOW UP WITH REGULAR CARDIOLOGIST- DR CHEEK    Your physician recommends that you schedule a follow-up appointment ON AS NEEDED BASIS  Studies Ordered:   No orders of the defined types were placed in this encounter.     Glenetta Hew, M.D., M.S. Interventional Cardiologist   Pager # [phone] Phone # [phone] [address] [place], [place] [address]

## 2016-08-26 ENCOUNTER — Encounter: Payer: Self-pay | Admitting: Cardiology

## 2016-08-26 DIAGNOSIS — R9431 Abnormal electrocardiogram [ECG] [EKG]: Secondary | ICD-10-CM | POA: Insufficient documentation

## 2016-08-26 NOTE — Assessment & Plan Note (Signed)
Status post bioprosthetic aortic valve replacement with normal sounding murmur. No heart failure symptoms. I suspect that some EKG changes are resultant from her disease. I don't know when her last echo was, but would defer to her primary cardiologist to evaluate.

## 2016-08-26 NOTE — Assessment & Plan Note (Signed)
In addition to having PACs and also bigeminal/trigeminal pattern, she does have deep T-wave inversions in the precordial leads. Again, her primary cardiology notes indicate that these T-wave inversions are probably not new. Since I do not have any old EKGs compare to, I would prefer to let her primary cardiologist reviewed these EKGs and see if improving else is new. She is not symptomatic at all, and I doubt that she has coronary ischemia. She had a cardiac catheterization prior to her valve surgery, has not had any anginal or heart failure symptoms.  Defer to primary cardiologist for continued management.

## 2016-08-26 NOTE — Assessment & Plan Note (Signed)
Profoundly hypertensive today, but this is something that is being evaluated and treated by her PCP, and probably by her primary cardiologist. Since I will not be following up with her after this visit, I don't think that it would be wise for me to treat. I would avoid AV nodal agents such as beta blockers or non-dihydropyridine calcium channel blockers.  Calcium channel blocker such as amlodipine may be beneficial as they are less likely to affect her heart rate.

## 2016-08-26 NOTE — Assessment & Plan Note (Signed)
She does have frequent PACs on her EKG with some baseline bradycardia. She is not on beta blocker. Her last clinic note from her primary cardiologist, Dr. Beverely Pace indicates that she has frequent PACs that are asymptomatic as a chronic diagnosis. It also indicates T-wave inversions that are chronic.  Based on the fact that these findings seem to be chronic, I don't think we need to readdress it any further unless it is done by her primary cardiologist who knows her better. I simply think that she should return to her primary cardiologist as routinely scheduled to discuss these findings.

## 2016-09-05 DIAGNOSIS — N19 Unspecified kidney failure: Secondary | ICD-10-CM | POA: Diagnosis not present

## 2016-09-05 DIAGNOSIS — R001 Bradycardia, unspecified: Secondary | ICD-10-CM | POA: Diagnosis not present

## 2016-09-05 DIAGNOSIS — Z6832 Body mass index (BMI) 32.0-32.9, adult: Secondary | ICD-10-CM | POA: Diagnosis not present

## 2016-09-05 DIAGNOSIS — I10 Essential (primary) hypertension: Secondary | ICD-10-CM | POA: Diagnosis not present

## 2016-09-20 ENCOUNTER — Ambulatory Visit (INDEPENDENT_AMBULATORY_CARE_PROVIDER_SITE_OTHER): Payer: Medicare HMO | Admitting: Podiatry

## 2016-09-20 DIAGNOSIS — E119 Type 2 diabetes mellitus without complications: Secondary | ICD-10-CM

## 2016-09-20 DIAGNOSIS — M79676 Pain in unspecified toe(s): Secondary | ICD-10-CM | POA: Diagnosis not present

## 2016-09-20 DIAGNOSIS — B351 Tinea unguium: Secondary | ICD-10-CM | POA: Diagnosis not present

## 2016-09-20 NOTE — Progress Notes (Signed)
Patient ID: Linda Lawson, female   DOB: October 27, 1935, 81 y.o.   MRN: [id number] Complaint:  Visit Type: Patient returns to my office for continued preventative foot care services. Complaint: Patient states" my nails have grown long and thick and become painful to walk and wear shoes" .Patient is a diabetic with no foot complications. The patient presents for preventative foot care services. No changes to ROS  Podiatric Exam: Vascular: dorsalis pedis and posterior tibial pulses are palpable bilateral. Capillary return is immediate. Temperature gradient is WNL. Skin turgor WNL  Sensorium: Absent  Semmes Weinstein monofilament test. Normal tactile sensation bilaterally. Nail Exam: Pt has thick disfigured discolored nails with subungual debris noted bilateral entire nail hallux through fifth toenails Ulcer Exam: There is no evidence of ulcer or pre-ulcerative changes or infection. Orthopedic Exam: Muscle tone and strength are WNL. No limitations in general ROM. No crepitus or effusions noted. Foot type and digits show no abnormalities. HAV 1st MPJ  B/L Skin:  Porokeratosis left heel.. No infection or ulcers  Diagnosis:  Onychomycosis, , Pain in right toe, pain in left toes   Treatment & Plan Procedures and Treatment: Consent by patient was obtained for treatment procedures. The patient understood the discussion of treatment and procedures well. All questions were answered thoroughly reviewed. Debridement of mycotic and hypertrophic toenails, 1 through 5 bilateral and clearing of subungual debris. No ulceration, no infection noted. Initiate diabetic shoes due to neuropathy and HAV  B/L Return Visit-Office Procedure: Patient instructed to return to the office for a follow up visit 3 months for continued evaluation and treatment.   Helane GuntherGregory Hezzie Karim DPM

## 2016-10-24 DIAGNOSIS — E782 Mixed hyperlipidemia: Secondary | ICD-10-CM | POA: Diagnosis not present

## 2016-10-24 DIAGNOSIS — E119 Type 2 diabetes mellitus without complications: Secondary | ICD-10-CM | POA: Diagnosis not present

## 2016-10-31 DIAGNOSIS — E782 Mixed hyperlipidemia: Secondary | ICD-10-CM | POA: Diagnosis not present

## 2016-10-31 DIAGNOSIS — I10 Essential (primary) hypertension: Secondary | ICD-10-CM | POA: Diagnosis not present

## 2016-10-31 DIAGNOSIS — E119 Type 2 diabetes mellitus without complications: Secondary | ICD-10-CM | POA: Diagnosis not present

## 2016-10-31 DIAGNOSIS — Z6832 Body mass index (BMI) 32.0-32.9, adult: Secondary | ICD-10-CM | POA: Diagnosis not present

## 2016-11-21 DIAGNOSIS — E119 Type 2 diabetes mellitus without complications: Secondary | ICD-10-CM | POA: Diagnosis not present

## 2016-11-21 DIAGNOSIS — Z6832 Body mass index (BMI) 32.0-32.9, adult: Secondary | ICD-10-CM | POA: Diagnosis not present

## 2016-11-21 DIAGNOSIS — I10 Essential (primary) hypertension: Secondary | ICD-10-CM | POA: Diagnosis not present

## 2016-12-21 ENCOUNTER — Ambulatory Visit (INDEPENDENT_AMBULATORY_CARE_PROVIDER_SITE_OTHER): Payer: Medicare HMO | Admitting: Podiatry

## 2016-12-21 DIAGNOSIS — E119 Type 2 diabetes mellitus without complications: Secondary | ICD-10-CM

## 2016-12-21 DIAGNOSIS — B351 Tinea unguium: Secondary | ICD-10-CM

## 2016-12-21 DIAGNOSIS — M79676 Pain in unspecified toe(s): Secondary | ICD-10-CM

## 2016-12-21 NOTE — Progress Notes (Signed)
Patient ID: Linda Lawson, female   DOB: 10/01/1935, 81 y.o.   MRN: [id number] Complaint:  Visit Type: Patient returns to my office for continued preventative foot care services. Complaint: Patient states" my nails have grown long and thick and become painful to walk and wear shoes" .Patient is a diabetic with no foot complications. The patient presents for preventative foot care services. No changes to ROS  Podiatric Exam: Vascular: dorsalis pedis and posterior tibial pulses are palpable bilateral. Capillary return is immediate. Temperature gradient is WNL. Skin turgor WNL  Sensorium: Absent  Semmes Weinstein monofilament test. Normal tactile sensation bilaterally. Nail Exam: Pt has thick disfigured discolored nails with subungual debris noted bilateral entire nail hallux through fifth toenails Ulcer Exam: There is no evidence of ulcer or pre-ulcerative changes or infection. Orthopedic Exam: Muscle tone and strength are WNL. No limitations in general ROM. No crepitus or effusions noted. Foot type and digits show no abnormalities. HAV 1st MPJ  B/L Skin:  Porokeratosis left heel.. No infection or ulcers  Diagnosis:  Onychomycosis, , Pain in right toe, pain in left toes   Treatment & Plan Procedures and Treatment: Consent by patient was obtained for treatment procedures. The patient understood the discussion of treatment and procedures well. All questions were answered thoroughly reviewed. Debridement of mycotic and hypertrophic toenails, 1 through 5 bilateral and clearing of subungual debris. No ulceration, no infection noted.  Return Visit-Office Procedure: Patient instructed to return to the office for a follow up visit 3 months for continued evaluation and treatment.   Shellsea Borunda DPM 

## 2017-01-30 DIAGNOSIS — E782 Mixed hyperlipidemia: Secondary | ICD-10-CM | POA: Diagnosis not present

## 2017-01-30 DIAGNOSIS — E119 Type 2 diabetes mellitus without complications: Secondary | ICD-10-CM | POA: Diagnosis not present

## 2017-02-01 DIAGNOSIS — R011 Cardiac murmur, unspecified: Secondary | ICD-10-CM | POA: Diagnosis not present

## 2017-02-01 DIAGNOSIS — E119 Type 2 diabetes mellitus without complications: Secondary | ICD-10-CM | POA: Diagnosis not present

## 2017-02-01 DIAGNOSIS — I10 Essential (primary) hypertension: Secondary | ICD-10-CM | POA: Diagnosis not present

## 2017-02-01 DIAGNOSIS — E782 Mixed hyperlipidemia: Secondary | ICD-10-CM | POA: Diagnosis not present

## 2017-02-22 ENCOUNTER — Ambulatory Visit (INDEPENDENT_AMBULATORY_CARE_PROVIDER_SITE_OTHER): Payer: Medicare HMO | Admitting: Orthopaedic Surgery

## 2017-02-28 ENCOUNTER — Ambulatory Visit (INDEPENDENT_AMBULATORY_CARE_PROVIDER_SITE_OTHER): Payer: Medicare HMO

## 2017-02-28 ENCOUNTER — Ambulatory Visit (INDEPENDENT_AMBULATORY_CARE_PROVIDER_SITE_OTHER): Payer: Medicare HMO | Admitting: Orthopaedic Surgery

## 2017-02-28 ENCOUNTER — Encounter (INDEPENDENT_AMBULATORY_CARE_PROVIDER_SITE_OTHER): Payer: Self-pay | Admitting: Orthopaedic Surgery

## 2017-02-28 DIAGNOSIS — Z96642 Presence of left artificial hip joint: Secondary | ICD-10-CM | POA: Diagnosis not present

## 2017-02-28 DIAGNOSIS — M25552 Pain in left hip: Secondary | ICD-10-CM | POA: Diagnosis not present

## 2017-02-28 NOTE — Progress Notes (Signed)
Office Visit Note   Patient: Linda Lawson           Date of Birth: 06-03-1935           MRN: [id number] Visit Date: 02/28/2017              Requested by: [name], MD [address] [place], [place] [address] PCP: [name], MD   Assessment & Plan: Visit Diagnoses:  1. Left hip pain     Plan: Overall impression is left hip contusion. The implant appears to be stable. I don't see any obvious evidence of a greater trochanter fracture. She could've had a nondisplaced one. Nevertheless I recommend conservative treatment. Questions encouraged and answered. We will see her back in a couple months for her 1 year visit for her hip replacement. She will need AP and lateral hip on return  Follow-Up Instructions: Return for as scheduled.   Orders:  Orders Placed This Encounter  Procedures  . XR Pelvis 1-2 Views   No orders of the defined types were placed in this encounter.     Procedures: No procedures performed   Clinical Data: No additional findings.   Subjective: Chief Complaint  Patient presents with  . Left Hip - Pain, Injury    Patient is approximately 10 months status post left total hip replacement and doing well. She comes in today because she recently fell couple times. She feels pain in the lateral aspect of the hip and buttock region. She denies any groin pain. Standing and walking are fine. Denies any numbness and tingling.    Review of Systems  Constitutional: Negative.   HENT: Negative.   Eyes: Negative.   Respiratory: Negative.   Cardiovascular: Negative.   Endocrine: Negative.   Musculoskeletal: Negative.   Neurological: Negative.   Hematological: Negative.   Psychiatric/Behavioral: Negative.   All other systems reviewed and are negative.    Objective: Vital Signs: There were no vitals taken for this visit.  Physical Exam  Constitutional: She is oriented to person, place, and time. She appears well-developed and  well-nourished.  Pulmonary/Chest: Effort normal.  Neurological: She is alert and oriented to person, place, and time.  Skin: Skin is warm. Capillary refill takes less than 2 seconds.  Psychiatric: She has a normal mood and affect. Her behavior is normal. Judgment and thought content normal.  Nursing note and vitals reviewed.   Ortho Exam Patient is able to ambulate normally without any limp or apparent pain. She is mildly tender to the lateral aspect of the hip. She has painless range of motion of the hip. Leg lengths are equal. Specialty Comments:  No specialty comments available.  Imaging: Xr Pelvis 1-2 Views  Result Date: 02/28/2017 No obvious evidence of complications.    PMFS History: Patient Active Problem List   Diagnosis Date Noted  . Left hip pain 02/28/2017  . Abnormal electrocardiogram (ECG) (EKG) 08/26/2016  . Premature atrial contractions - asymptomatic 08/24/2016  . Essential hypertension   . Hip joint replacement status 04/19/2016  . Primary osteoarthritis of left hip   . Status post aortic valve replacement with bioprosthetic valve 08/24/2008   Past Medical History:  Diagnosis Date  . Arthritis   . Complication of anesthesia   . Depression    situatinal  . Diabetes mellitus without complication (HCC)    Type II  . Essential hypertension   . Gout   . Hip joint replacement status   . History of  aortic valvular stenosis - s/p AVR 2010   s/p AVR (tissue) 2010; normal valve function by echo in 2016  . Hyperlipidemia   . Pneumonia 2010  . PONV (postoperative nausea and vomiting)   . Premature atrial contractions - asymptomatic 08/24/2016  . Primary osteoarthritis of left hip   . Status post aortic valve replacement with bioprosthetic valve 08/24/2008    Family History  Problem Relation Age of Onset  . Diabetes Maternal Grandmother   . Heart disease Maternal Grandmother   . Diabetes Sister     Past Surgical History:  Procedure Laterality Date  .  ABDOMINAL HYSTERECTOMY    . COLONOSCOPY    . HIP ARTHROPLASTY Right 2002  . JOINT REPLACEMENT    . KNEE ARTHROPLASTY Left 2012  . pericardial tissue heart valve  2010   AVR  . TOTAL HIP ARTHROPLASTY Left 04/19/2016  . TOTAL HIP ARTHROPLASTY Left 04/19/2016   Procedure: LEFT TOTAL HIP ARTHROPLASTY ANTERIOR APPROACH;  Surgeon: Tarry KosNaiping M Xu, MD;  Location: [facility] OR;  Service: Orthopedics;  Laterality: Left;   Social History   Occupational History  . Not on file.   Social History Main Topics  . Smoking status: Former Smoker    Years: 20.00    Quit date: 06/12/2008  . Smokeless tobacco: Never Used  . Alcohol use No  . Drug use: No  . Sexual activity: Not on file

## 2017-03-11 DIAGNOSIS — R6889 Other general symptoms and signs: Secondary | ICD-10-CM | POA: Diagnosis not present

## 2017-03-11 DIAGNOSIS — Z952 Presence of prosthetic heart valve: Secondary | ICD-10-CM | POA: Diagnosis not present

## 2017-03-26 ENCOUNTER — Ambulatory Visit: Payer: Medicare HMO | Admitting: Podiatry

## 2017-03-26 ENCOUNTER — Encounter: Payer: Self-pay | Admitting: Podiatry

## 2017-03-26 DIAGNOSIS — B351 Tinea unguium: Secondary | ICD-10-CM

## 2017-03-26 DIAGNOSIS — M2010 Hallux valgus (acquired), unspecified foot: Secondary | ICD-10-CM

## 2017-03-26 DIAGNOSIS — E119 Type 2 diabetes mellitus without complications: Secondary | ICD-10-CM | POA: Diagnosis not present

## 2017-03-26 DIAGNOSIS — M79676 Pain in unspecified toe(s): Secondary | ICD-10-CM

## 2017-03-26 NOTE — Progress Notes (Signed)
Patient ID: Linda Lawson, female   DOB: May 05, 1936, 81 y.o.   MRN: [id number] Complaint:  Visit Type: Patient returns to my office for continued preventative foot care services. Complaint: Patient states" my nails have grown long and thick and become painful to walk and wear shoes" .Patient is a diabetic with no foot complications. The patient presents for preventative foot care services. No changes to ROS  Podiatric Exam: Vascular: dorsalis pedis and posterior tibial pulses are palpable bilateral. Capillary return is immediate. Temperature gradient is WNL. Skin turgor WNL  Sensorium: Absent  Semmes Weinstein monofilament test. Normal tactile sensation bilaterally. Nail Exam: Pt has thick disfigured discolored nails with subungual debris noted bilateral entire nail hallux through fifth toenails Ulcer Exam: There is no evidence of ulcer or pre-ulcerative changes or infection. Orthopedic Exam: Muscle tone and strength are WNL. No limitations in general ROM. No crepitus or effusions noted. Foot type and digits show no abnormalities. HAV 1st MPJ  B/L Skin:  Porokeratosis left heel.. No infection or ulcers  Diagnosis:  Onychomycosis, , Pain in right toe, pain in left toes   Treatment & Plan Procedures and Treatment: Consent by patient was obtained for treatment procedures. The patient understood the discussion of treatment and procedures well. All questions were answered thoroughly reviewed. Debridement of mycotic and hypertrophic toenails, 1 through 5 bilateral and clearing of subungual debris. No ulceration, no infection noted.  Return Visit-Office Procedure: Patient instructed to return to the office for a follow up visit 3 months for continued evaluation and treatment.   Helane GuntherGregory Icel Castles DPM

## 2017-04-19 DIAGNOSIS — E119 Type 2 diabetes mellitus without complications: Secondary | ICD-10-CM | POA: Diagnosis not present

## 2017-04-19 DIAGNOSIS — I10 Essential (primary) hypertension: Secondary | ICD-10-CM | POA: Diagnosis not present

## 2017-04-19 DIAGNOSIS — E782 Mixed hyperlipidemia: Secondary | ICD-10-CM | POA: Diagnosis not present

## 2017-04-24 DIAGNOSIS — E119 Type 2 diabetes mellitus without complications: Secondary | ICD-10-CM | POA: Diagnosis not present

## 2017-04-24 DIAGNOSIS — Z0000 Encounter for general adult medical examination without abnormal findings: Secondary | ICD-10-CM | POA: Diagnosis not present

## 2017-04-24 DIAGNOSIS — I10 Essential (primary) hypertension: Secondary | ICD-10-CM | POA: Diagnosis not present

## 2017-04-24 DIAGNOSIS — H9193 Unspecified hearing loss, bilateral: Secondary | ICD-10-CM | POA: Diagnosis not present

## 2017-04-24 DIAGNOSIS — E782 Mixed hyperlipidemia: Secondary | ICD-10-CM | POA: Diagnosis not present

## 2017-04-24 DIAGNOSIS — Z23 Encounter for immunization: Secondary | ICD-10-CM | POA: Diagnosis not present

## 2017-04-24 DIAGNOSIS — H6123 Impacted cerumen, bilateral: Secondary | ICD-10-CM | POA: Diagnosis not present

## 2017-04-24 DIAGNOSIS — Z1211 Encounter for screening for malignant neoplasm of colon: Secondary | ICD-10-CM | POA: Diagnosis not present

## 2017-04-24 DIAGNOSIS — Z6832 Body mass index (BMI) 32.0-32.9, adult: Secondary | ICD-10-CM | POA: Diagnosis not present

## 2017-05-03 DIAGNOSIS — G4762 Sleep related leg cramps: Secondary | ICD-10-CM | POA: Diagnosis not present

## 2017-05-03 DIAGNOSIS — R252 Cramp and spasm: Secondary | ICD-10-CM | POA: Diagnosis not present

## 2017-05-03 DIAGNOSIS — Z79899 Other long term (current) drug therapy: Secondary | ICD-10-CM | POA: Diagnosis not present

## 2017-05-03 DIAGNOSIS — Z6832 Body mass index (BMI) 32.0-32.9, adult: Secondary | ICD-10-CM | POA: Diagnosis not present

## 2017-05-09 ENCOUNTER — Encounter (INDEPENDENT_AMBULATORY_CARE_PROVIDER_SITE_OTHER): Payer: Self-pay | Admitting: Orthopaedic Surgery

## 2017-05-09 ENCOUNTER — Ambulatory Visit (INDEPENDENT_AMBULATORY_CARE_PROVIDER_SITE_OTHER): Payer: Medicare HMO

## 2017-05-09 ENCOUNTER — Ambulatory Visit (INDEPENDENT_AMBULATORY_CARE_PROVIDER_SITE_OTHER): Payer: Medicare HMO | Admitting: Orthopaedic Surgery

## 2017-05-09 DIAGNOSIS — Z96642 Presence of left artificial hip joint: Secondary | ICD-10-CM | POA: Diagnosis not present

## 2017-05-09 DIAGNOSIS — M1612 Unilateral primary osteoarthritis, left hip: Secondary | ICD-10-CM

## 2017-05-09 NOTE — Progress Notes (Signed)
Office Visit Note   Patient: Linda Lawson           Date of Birth: Jan 31, 1936           MRN: [id number] Visit Date: 05/09/2017              Requested by: Lewis Moccasinewey, Elizabeth R, MD [address] [place], [place] [id number] PCP: Lewis Moccasinewey, Elizabeth R, MD   Assessment & Plan: Visit Diagnoses:  1. Status post left hip replacement   2. Primary osteoarthritis of left hip     Plan: Patient is doing well for her one-year checkup.  Dental prophylaxis was reinforced.  Activity as tolerated.  Follow-up in 1 year with AP pelvis and lateral hip.  Likely will have patient follow-up as needed at that point.  Follow-Up Instructions: Return in about 1 year (around 05/09/2018).   Orders:  Orders Placed This Encounter  Procedures  . XR HIP UNILAT W OR W/O PELVIS 2-3 VIEWS LEFT   No orders of the defined types were placed in this encounter.     Procedures: No procedures performed   Clinical Data: No additional findings.   Subjective: Chief Complaint  Patient presents with  . Left Hip - Pain    Patient is one year status post left total hip replacement.  She is doing well.  She does complain of just some start up stiffness but overall she is very happy with her surgery.    Review of Systems   Objective: Vital Signs: There were no vitals taken for this visit.  Physical Exam  Ortho Exam Surgical scar is fully healed.  She has painless rotation of the hip.  Leg lengths are equal. Specialty Comments:  No specialty comments available.  Imaging: Xr Hip Unilat W Or W/o Pelvis 2-3 Views Left  Result Date: 05/09/2017 Stable left total hip replacement in good alignment    PMFS History: Patient Active Problem List   Diagnosis Date Noted  . Left hip pain 02/28/2017  . Abnormal electrocardiogram (ECG) (EKG) 08/26/2016  . Premature atrial contractions - asymptomatic 08/24/2016  . Essential hypertension   . Hip joint replacement status 04/19/2016  . Primary osteoarthritis  of left hip   . Status post aortic valve replacement with bioprosthetic valve 08/24/2008   Past Medical History:  Diagnosis Date  . Arthritis   . Complication of anesthesia   . Depression    situatinal  . Diabetes mellitus without complication (HCC)    Type II  . Essential hypertension   . Gout   . Hip joint replacement status   . History of aortic valvular stenosis - s/p AVR 2010   s/p AVR (tissue) 2010; normal valve function by echo in 2016  . Hyperlipidemia   . Pneumonia 2010  . PONV (postoperative nausea and vomiting)   . Premature atrial contractions - asymptomatic 08/24/2016  . Primary osteoarthritis of left hip   . Status post aortic valve replacement with bioprosthetic valve 08/24/2008    Family History  Problem Relation Age of Onset  . Diabetes Maternal Grandmother   . Heart disease Maternal Grandmother   . Diabetes Sister     Past Surgical History:  Procedure Laterality Date  . ABDOMINAL HYSTERECTOMY    . COLONOSCOPY    . HIP ARTHROPLASTY Right 2002  . JOINT REPLACEMENT    . KNEE ARTHROPLASTY Left 2012  . pericardial tissue heart valve  2010   AVR  . TOTAL HIP ARTHROPLASTY Left 04/19/2016  . TOTAL  HIP ARTHROPLASTY Left 04/19/2016   Procedure: LEFT TOTAL HIP ARTHROPLASTY ANTERIOR APPROACH;  Surgeon: Tarry KosNaiping M Lennix Rotundo, MD;  Location: [facility] OR;  Service: Orthopedics;  Laterality: Left;   Social History   Occupational History  . Not on file  Tobacco Use  . Smoking status: Former Smoker    Years: 20.00    Last attempt to quit: 06/12/2008    Years since quitting: 8.9  . Smokeless tobacco: Never Used  Substance and Sexual Activity  . Alcohol use: No  . Drug use: No  . Sexual activity: Not on file

## 2017-05-10 DIAGNOSIS — D649 Anemia, unspecified: Secondary | ICD-10-CM | POA: Diagnosis not present

## 2017-05-10 DIAGNOSIS — G4762 Sleep related leg cramps: Secondary | ICD-10-CM | POA: Diagnosis not present

## 2017-05-10 DIAGNOSIS — E559 Vitamin D deficiency, unspecified: Secondary | ICD-10-CM | POA: Diagnosis not present

## 2017-06-04 DIAGNOSIS — E782 Mixed hyperlipidemia: Secondary | ICD-10-CM | POA: Diagnosis not present

## 2017-06-06 DIAGNOSIS — E119 Type 2 diabetes mellitus without complications: Secondary | ICD-10-CM | POA: Diagnosis not present

## 2017-06-06 DIAGNOSIS — E782 Mixed hyperlipidemia: Secondary | ICD-10-CM | POA: Diagnosis not present

## 2017-06-06 DIAGNOSIS — Z6832 Body mass index (BMI) 32.0-32.9, adult: Secondary | ICD-10-CM | POA: Diagnosis not present

## 2017-06-06 DIAGNOSIS — I10 Essential (primary) hypertension: Secondary | ICD-10-CM | POA: Diagnosis not present

## 2017-06-25 ENCOUNTER — Ambulatory Visit: Payer: Medicare HMO | Admitting: Podiatry

## 2017-06-25 DIAGNOSIS — E875 Hyperkalemia: Secondary | ICD-10-CM | POA: Diagnosis not present

## 2017-06-25 DIAGNOSIS — R7989 Other specified abnormal findings of blood chemistry: Secondary | ICD-10-CM | POA: Diagnosis not present

## 2017-06-25 DIAGNOSIS — I10 Essential (primary) hypertension: Secondary | ICD-10-CM | POA: Diagnosis not present

## 2017-06-27 DIAGNOSIS — I10 Essential (primary) hypertension: Secondary | ICD-10-CM | POA: Diagnosis not present

## 2017-06-27 DIAGNOSIS — E782 Mixed hyperlipidemia: Secondary | ICD-10-CM | POA: Diagnosis not present

## 2017-06-27 DIAGNOSIS — E875 Hyperkalemia: Secondary | ICD-10-CM | POA: Diagnosis not present

## 2017-06-27 DIAGNOSIS — N19 Unspecified kidney failure: Secondary | ICD-10-CM | POA: Diagnosis not present

## 2017-07-01 DIAGNOSIS — E875 Hyperkalemia: Secondary | ICD-10-CM | POA: Diagnosis not present

## 2017-07-01 DIAGNOSIS — N19 Unspecified kidney failure: Secondary | ICD-10-CM | POA: Diagnosis not present

## 2017-07-01 DIAGNOSIS — I10 Essential (primary) hypertension: Secondary | ICD-10-CM | POA: Diagnosis not present

## 2017-07-03 DIAGNOSIS — I10 Essential (primary) hypertension: Secondary | ICD-10-CM | POA: Diagnosis not present

## 2017-07-03 DIAGNOSIS — E875 Hyperkalemia: Secondary | ICD-10-CM | POA: Diagnosis not present

## 2017-07-03 DIAGNOSIS — N19 Unspecified kidney failure: Secondary | ICD-10-CM | POA: Diagnosis not present

## 2017-07-03 DIAGNOSIS — Z6832 Body mass index (BMI) 32.0-32.9, adult: Secondary | ICD-10-CM | POA: Diagnosis not present

## 2017-07-12 ENCOUNTER — Encounter: Payer: Self-pay | Admitting: Podiatry

## 2017-07-12 ENCOUNTER — Ambulatory Visit: Payer: Medicare HMO | Admitting: Podiatry

## 2017-07-12 DIAGNOSIS — E119 Type 2 diabetes mellitus without complications: Secondary | ICD-10-CM

## 2017-07-12 DIAGNOSIS — M2010 Hallux valgus (acquired), unspecified foot: Secondary | ICD-10-CM

## 2017-07-12 DIAGNOSIS — L608 Other nail disorders: Secondary | ICD-10-CM

## 2017-07-12 DIAGNOSIS — B351 Tinea unguium: Secondary | ICD-10-CM | POA: Diagnosis not present

## 2017-07-12 DIAGNOSIS — M79676 Pain in unspecified toe(s): Secondary | ICD-10-CM | POA: Diagnosis not present

## 2017-07-12 NOTE — Progress Notes (Signed)
Patient ID: Linda Lawson, female   DOB: 12-09-1935, 82 y.o.   MRN: [id number] Complaint:  Visit Type: Patient returns to my office for continued preventative foot care services. Complaint: Patient states" my nails have grown long and thick and become painful to walk and wear shoes" .Patient is a diabetic with no foot complications. The patient presents for preventative foot care services. No changes to ROS  Podiatric Exam: Vascular: dorsalis pedis and posterior tibial pulses are palpable bilateral. Capillary return is immediate. Temperature gradient is WNL. Skin turgor WNL  Sensorium: Absent  Semmes Weinstein monofilament test. Normal tactile sensation bilaterally. Nail Exam: Pt has thick disfigured discolored nails with subungual debris noted bilateral entire nail hallux through fifth toenails Ulcer Exam: There is no evidence of ulcer or pre-ulcerative changes or infection. Orthopedic Exam: Muscle tone and strength are WNL. No limitations in general ROM. No crepitus or effusions noted. Foot type and digits show no abnormalities.  DJD 1st MPJ right. Skin:  Porokeratosis left heel.. No infection or ulcers  Diagnosis:  Onychomycosis, , Pain in right toe, pain in left toes   Treatment & Plan Procedures and Treatment: Consent by patient was obtained for treatment procedures. The patient understood the discussion of treatment and procedures well. All questions were answered thoroughly reviewed. Debridement of mycotic and hypertrophic toenails, 1 through 5 bilateral and clearing of subungual debris. No ulceration, no infection noted.  Return Visit-Office Procedure: Patient instructed to return to the office for a follow up visit 3 months for continued evaluation and treatment.   Helane GuntherGregory Aparna Vanderweele DPM

## 2017-08-07 DIAGNOSIS — E119 Type 2 diabetes mellitus without complications: Secondary | ICD-10-CM | POA: Diagnosis not present

## 2017-08-07 DIAGNOSIS — D649 Anemia, unspecified: Secondary | ICD-10-CM | POA: Diagnosis not present

## 2017-08-07 DIAGNOSIS — E782 Mixed hyperlipidemia: Secondary | ICD-10-CM | POA: Diagnosis not present

## 2017-08-07 DIAGNOSIS — N19 Unspecified kidney failure: Secondary | ICD-10-CM | POA: Diagnosis not present

## 2017-08-07 DIAGNOSIS — E559 Vitamin D deficiency, unspecified: Secondary | ICD-10-CM | POA: Diagnosis not present

## 2017-08-09 DIAGNOSIS — E875 Hyperkalemia: Secondary | ICD-10-CM | POA: Diagnosis not present

## 2017-08-09 DIAGNOSIS — M109 Gout, unspecified: Secondary | ICD-10-CM | POA: Diagnosis not present

## 2017-08-09 DIAGNOSIS — Z6832 Body mass index (BMI) 32.0-32.9, adult: Secondary | ICD-10-CM | POA: Diagnosis not present

## 2017-08-09 DIAGNOSIS — N19 Unspecified kidney failure: Secondary | ICD-10-CM | POA: Diagnosis not present

## 2017-08-09 DIAGNOSIS — D649 Anemia, unspecified: Secondary | ICD-10-CM | POA: Diagnosis not present

## 2017-08-09 DIAGNOSIS — I10 Essential (primary) hypertension: Secondary | ICD-10-CM | POA: Diagnosis not present

## 2017-08-09 DIAGNOSIS — E119 Type 2 diabetes mellitus without complications: Secondary | ICD-10-CM | POA: Diagnosis not present

## 2017-09-06 DIAGNOSIS — M109 Gout, unspecified: Secondary | ICD-10-CM | POA: Diagnosis not present

## 2017-09-06 DIAGNOSIS — I10 Essential (primary) hypertension: Secondary | ICD-10-CM | POA: Diagnosis not present

## 2017-09-06 DIAGNOSIS — R7989 Other specified abnormal findings of blood chemistry: Secondary | ICD-10-CM | POA: Diagnosis not present

## 2017-09-09 DIAGNOSIS — I10 Essential (primary) hypertension: Secondary | ICD-10-CM | POA: Diagnosis not present

## 2017-09-09 DIAGNOSIS — Z6832 Body mass index (BMI) 32.0-32.9, adult: Secondary | ICD-10-CM | POA: Diagnosis not present

## 2017-09-09 DIAGNOSIS — M109 Gout, unspecified: Secondary | ICD-10-CM | POA: Diagnosis not present

## 2017-09-09 DIAGNOSIS — G4762 Sleep related leg cramps: Secondary | ICD-10-CM | POA: Diagnosis not present

## 2017-09-30 DIAGNOSIS — N189 Chronic kidney disease, unspecified: Secondary | ICD-10-CM | POA: Diagnosis not present

## 2017-09-30 DIAGNOSIS — E875 Hyperkalemia: Secondary | ICD-10-CM | POA: Diagnosis not present

## 2017-09-30 DIAGNOSIS — N19 Unspecified kidney failure: Secondary | ICD-10-CM | POA: Diagnosis not present

## 2017-09-30 DIAGNOSIS — Z1159 Encounter for screening for other viral diseases: Secondary | ICD-10-CM | POA: Diagnosis not present

## 2017-09-30 DIAGNOSIS — M791 Myalgia, unspecified site: Secondary | ICD-10-CM | POA: Diagnosis not present

## 2017-10-03 DIAGNOSIS — I10 Essential (primary) hypertension: Secondary | ICD-10-CM | POA: Diagnosis not present

## 2017-10-03 DIAGNOSIS — Z6835 Body mass index (BMI) 35.0-35.9, adult: Secondary | ICD-10-CM | POA: Diagnosis not present

## 2017-10-03 DIAGNOSIS — E782 Mixed hyperlipidemia: Secondary | ICD-10-CM | POA: Diagnosis not present

## 2017-10-03 DIAGNOSIS — N189 Chronic kidney disease, unspecified: Secondary | ICD-10-CM | POA: Diagnosis not present

## 2017-10-03 DIAGNOSIS — E875 Hyperkalemia: Secondary | ICD-10-CM | POA: Diagnosis not present

## 2017-10-11 ENCOUNTER — Ambulatory Visit: Payer: Medicare HMO | Admitting: Podiatry

## 2017-10-11 DIAGNOSIS — M791 Myalgia, unspecified site: Secondary | ICD-10-CM | POA: Diagnosis not present

## 2017-10-11 DIAGNOSIS — N189 Chronic kidney disease, unspecified: Secondary | ICD-10-CM | POA: Diagnosis not present

## 2017-10-15 DIAGNOSIS — G4762 Sleep related leg cramps: Secondary | ICD-10-CM | POA: Diagnosis not present

## 2017-10-15 DIAGNOSIS — E559 Vitamin D deficiency, unspecified: Secondary | ICD-10-CM | POA: Diagnosis not present

## 2017-10-15 DIAGNOSIS — Z6832 Body mass index (BMI) 32.0-32.9, adult: Secondary | ICD-10-CM | POA: Diagnosis not present

## 2017-10-15 DIAGNOSIS — E782 Mixed hyperlipidemia: Secondary | ICD-10-CM | POA: Diagnosis not present

## 2017-10-15 DIAGNOSIS — E119 Type 2 diabetes mellitus without complications: Secondary | ICD-10-CM | POA: Diagnosis not present

## 2017-10-15 DIAGNOSIS — E875 Hyperkalemia: Secondary | ICD-10-CM | POA: Diagnosis not present

## 2017-10-17 DIAGNOSIS — E119 Type 2 diabetes mellitus without complications: Secondary | ICD-10-CM | POA: Insufficient documentation

## 2017-10-17 DIAGNOSIS — R6889 Other general symptoms and signs: Secondary | ICD-10-CM | POA: Diagnosis not present

## 2017-10-17 DIAGNOSIS — I2510 Atherosclerotic heart disease of native coronary artery without angina pectoris: Secondary | ICD-10-CM | POA: Diagnosis not present

## 2017-10-17 DIAGNOSIS — I10 Essential (primary) hypertension: Secondary | ICD-10-CM | POA: Diagnosis not present

## 2017-10-17 DIAGNOSIS — Z952 Presence of prosthetic heart valve: Secondary | ICD-10-CM | POA: Diagnosis not present

## 2017-10-17 DIAGNOSIS — R0600 Dyspnea, unspecified: Secondary | ICD-10-CM | POA: Diagnosis not present

## 2017-10-17 DIAGNOSIS — R0602 Shortness of breath: Secondary | ICD-10-CM | POA: Diagnosis not present

## 2017-10-28 DIAGNOSIS — R6889 Other general symptoms and signs: Secondary | ICD-10-CM | POA: Diagnosis not present

## 2017-10-28 DIAGNOSIS — R0602 Shortness of breath: Secondary | ICD-10-CM | POA: Diagnosis not present

## 2017-10-28 DIAGNOSIS — I2510 Atherosclerotic heart disease of native coronary artery without angina pectoris: Secondary | ICD-10-CM | POA: Diagnosis not present

## 2017-10-28 DIAGNOSIS — R0609 Other forms of dyspnea: Secondary | ICD-10-CM | POA: Diagnosis not present

## 2017-10-28 HISTORY — PX: OTHER SURGICAL HISTORY: SHX169

## 2017-11-01 ENCOUNTER — Encounter: Payer: Self-pay | Admitting: Podiatry

## 2017-11-01 ENCOUNTER — Ambulatory Visit: Payer: Medicare HMO | Admitting: Podiatry

## 2017-11-01 DIAGNOSIS — B351 Tinea unguium: Secondary | ICD-10-CM | POA: Diagnosis not present

## 2017-11-01 DIAGNOSIS — M79676 Pain in unspecified toe(s): Secondary | ICD-10-CM

## 2017-11-01 DIAGNOSIS — E119 Type 2 diabetes mellitus without complications: Secondary | ICD-10-CM | POA: Diagnosis not present

## 2017-11-01 DIAGNOSIS — L608 Other nail disorders: Secondary | ICD-10-CM

## 2017-11-01 NOTE — Progress Notes (Signed)
Patient ID: Linda Lawson, female   DOB: 12/09/1935, 82 y.o.   MRN: [id number] Complaint:  Visit Type: Patient returns to my office for continued preventative foot care services. Complaint: Patient states" my nails have grown long and thick and become painful to walk and wear shoes" .Patient is a diabetic with no foot complications. The patient presents for preventative foot care services. No changes to ROS  Podiatric Exam: Vascular: dorsalis pedis and posterior tibial pulses are palpable bilateral. Capillary return is immediate. Temperature gradient is WNL. Skin turgor WNL  Sensorium: Absent  Semmes Weinstein monofilament test. Normal tactile sensation bilaterally. Nail Exam: Pt has thick disfigured discolored nails with subungual debris noted bilateral entire nail hallux through fifth toenails Ulcer Exam: There is no evidence of ulcer or pre-ulcerative changes or infection. Orthopedic Exam: Muscle tone and strength are WNL. No limitations in general ROM. No crepitus or effusions noted. Foot type and digits show no abnormalities.  DJD 1st MPJ right. Skin:  Porokeratosis left heel.. No infection or ulcers  Diagnosis:  Onychomycosis, , Pain in right toe, pain in left toes   Treatment & Plan Procedures and Treatment: Consent by patient was obtained for treatment procedures. The patient understood the discussion of treatment and procedures well. All questions were answered thoroughly reviewed. Debridement of mycotic and hypertrophic toenails, 1 through 5 bilateral and clearing of subungual debris. No ulceration, no infection noted.  Return Visit-Office Procedure: Patient instructed to return to the office for a follow up visit 3 months for continued evaluation and treatment.   Cloyce Paterson DPM 

## 2017-11-08 DIAGNOSIS — N19 Unspecified kidney failure: Secondary | ICD-10-CM | POA: Diagnosis not present

## 2017-11-08 DIAGNOSIS — E875 Hyperkalemia: Secondary | ICD-10-CM | POA: Diagnosis not present

## 2017-11-08 DIAGNOSIS — M109 Gout, unspecified: Secondary | ICD-10-CM | POA: Diagnosis not present

## 2017-11-08 DIAGNOSIS — E119 Type 2 diabetes mellitus without complications: Secondary | ICD-10-CM | POA: Diagnosis not present

## 2017-11-08 DIAGNOSIS — E782 Mixed hyperlipidemia: Secondary | ICD-10-CM | POA: Diagnosis not present

## 2017-11-11 DIAGNOSIS — D649 Anemia, unspecified: Secondary | ICD-10-CM | POA: Diagnosis not present

## 2017-11-11 DIAGNOSIS — R0602 Shortness of breath: Secondary | ICD-10-CM | POA: Diagnosis not present

## 2017-11-11 DIAGNOSIS — M109 Gout, unspecified: Secondary | ICD-10-CM | POA: Diagnosis not present

## 2017-11-11 DIAGNOSIS — E782 Mixed hyperlipidemia: Secondary | ICD-10-CM | POA: Diagnosis not present

## 2017-11-11 DIAGNOSIS — E119 Type 2 diabetes mellitus without complications: Secondary | ICD-10-CM | POA: Diagnosis not present

## 2017-11-11 DIAGNOSIS — N189 Chronic kidney disease, unspecified: Secondary | ICD-10-CM | POA: Diagnosis not present

## 2017-11-11 DIAGNOSIS — Z6832 Body mass index (BMI) 32.0-32.9, adult: Secondary | ICD-10-CM | POA: Diagnosis not present

## 2017-11-29 DIAGNOSIS — I340 Nonrheumatic mitral (valve) insufficiency: Secondary | ICD-10-CM | POA: Diagnosis not present

## 2017-11-29 DIAGNOSIS — I517 Cardiomegaly: Secondary | ICD-10-CM | POA: Diagnosis not present

## 2017-11-29 DIAGNOSIS — R6889 Other general symptoms and signs: Secondary | ICD-10-CM | POA: Diagnosis not present

## 2017-12-04 HISTORY — PX: TRANSTHORACIC ECHOCARDIOGRAM: SHX275

## 2017-12-08 IMAGING — CR DG HIP (WITH OR WITHOUT PELVIS) 2-3V*L*
3 series · 3 of 3 positions shown · non-contrast
Comparison: None.

CLINICAL DATA: 72-year-old female with osteoarthritis of the left
hip.

EXAM:
DG HIP (WITH OR WITHOUT PELVIS) 2-3V LEFT

[view not recorded (1 of 3)]
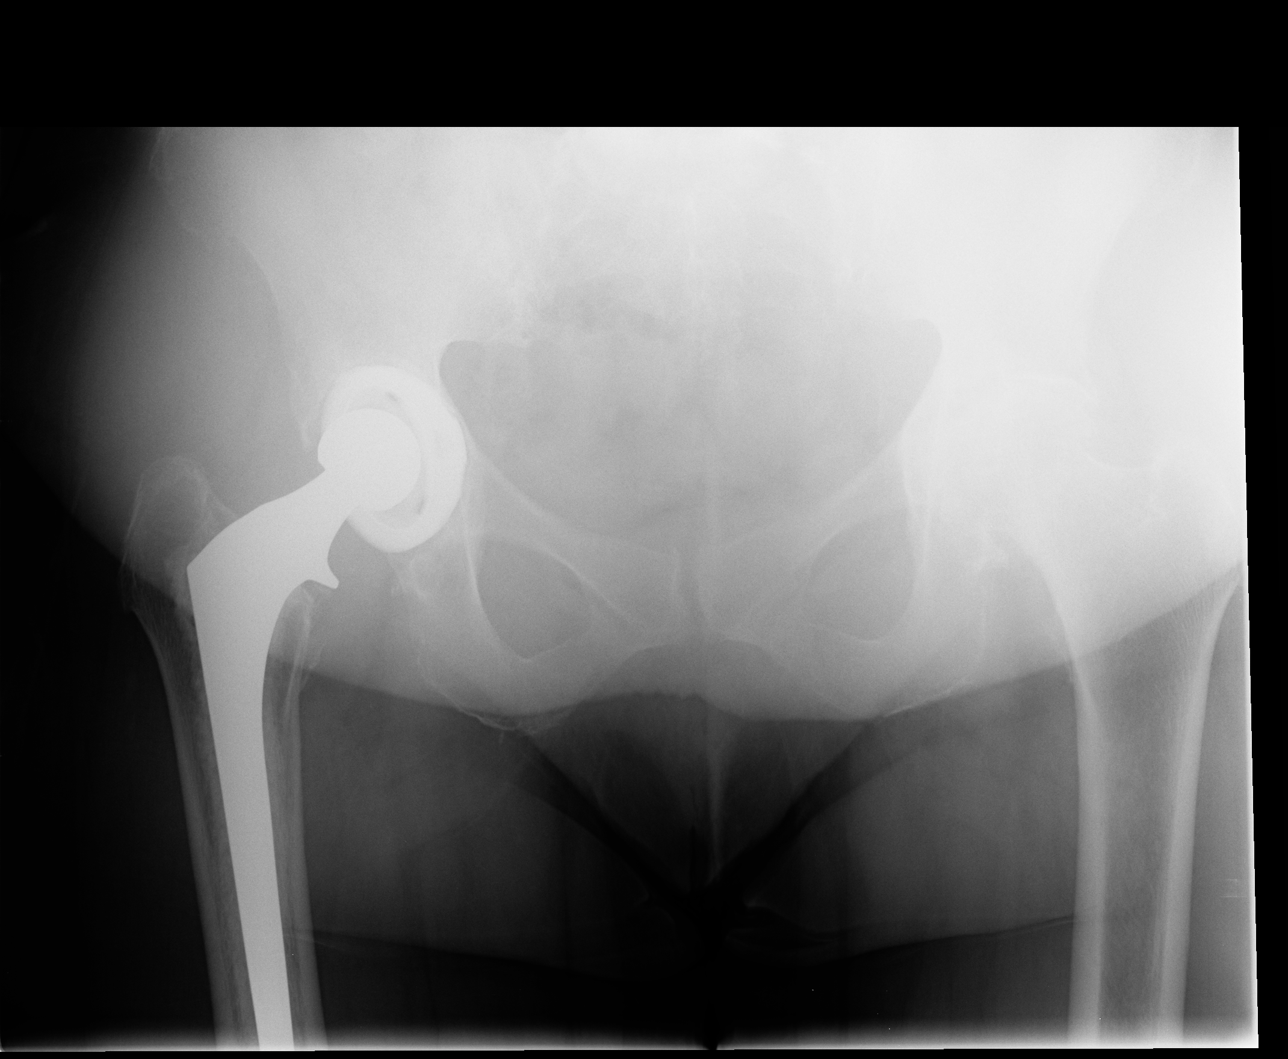

[view not recorded (2 of 3)]
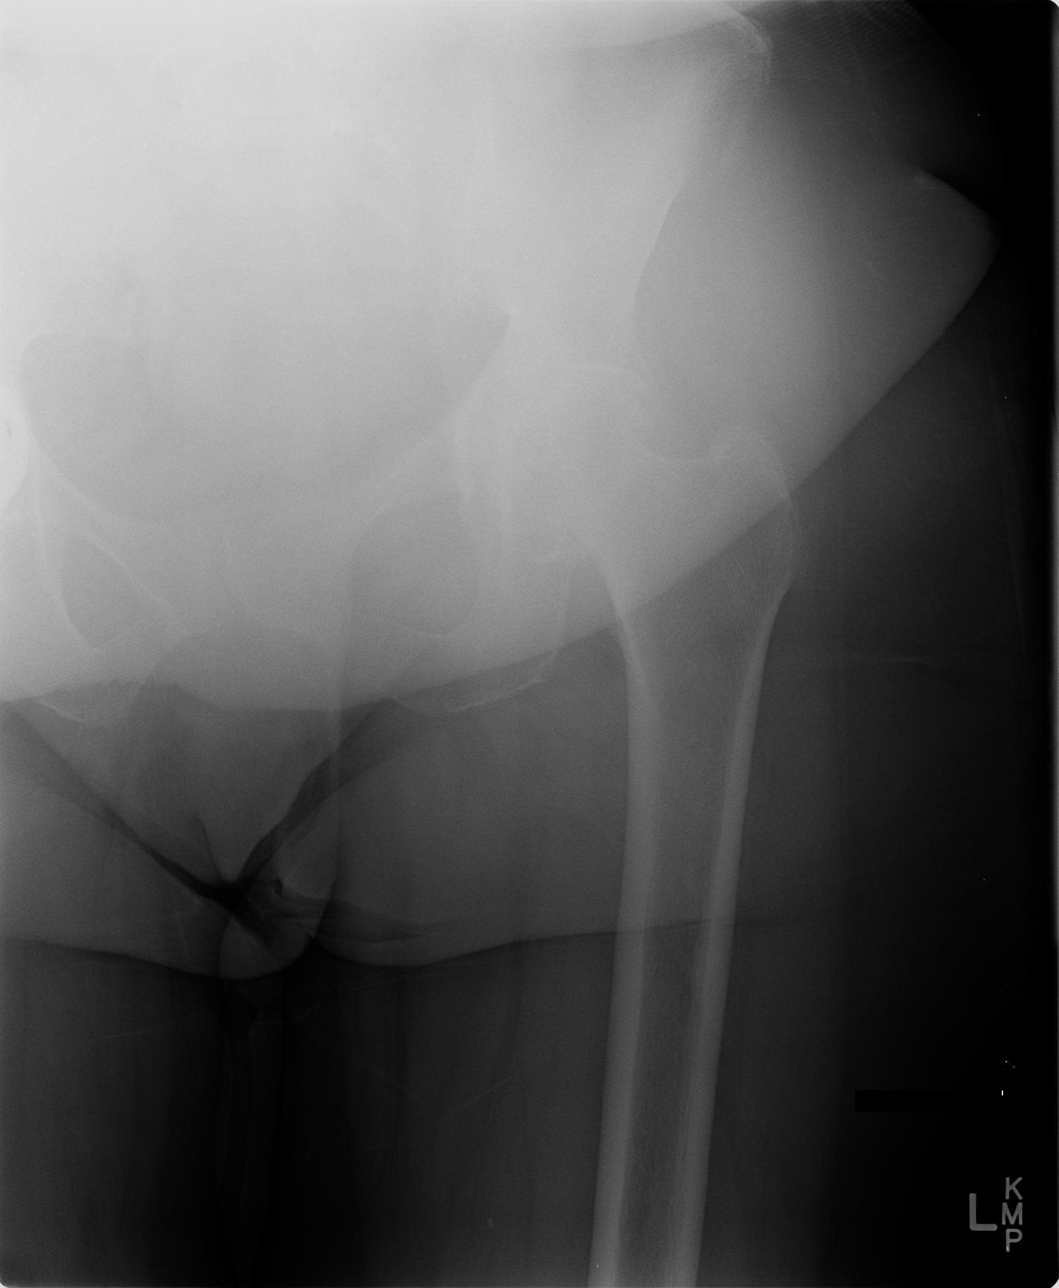

[view not recorded (3 of 3)]
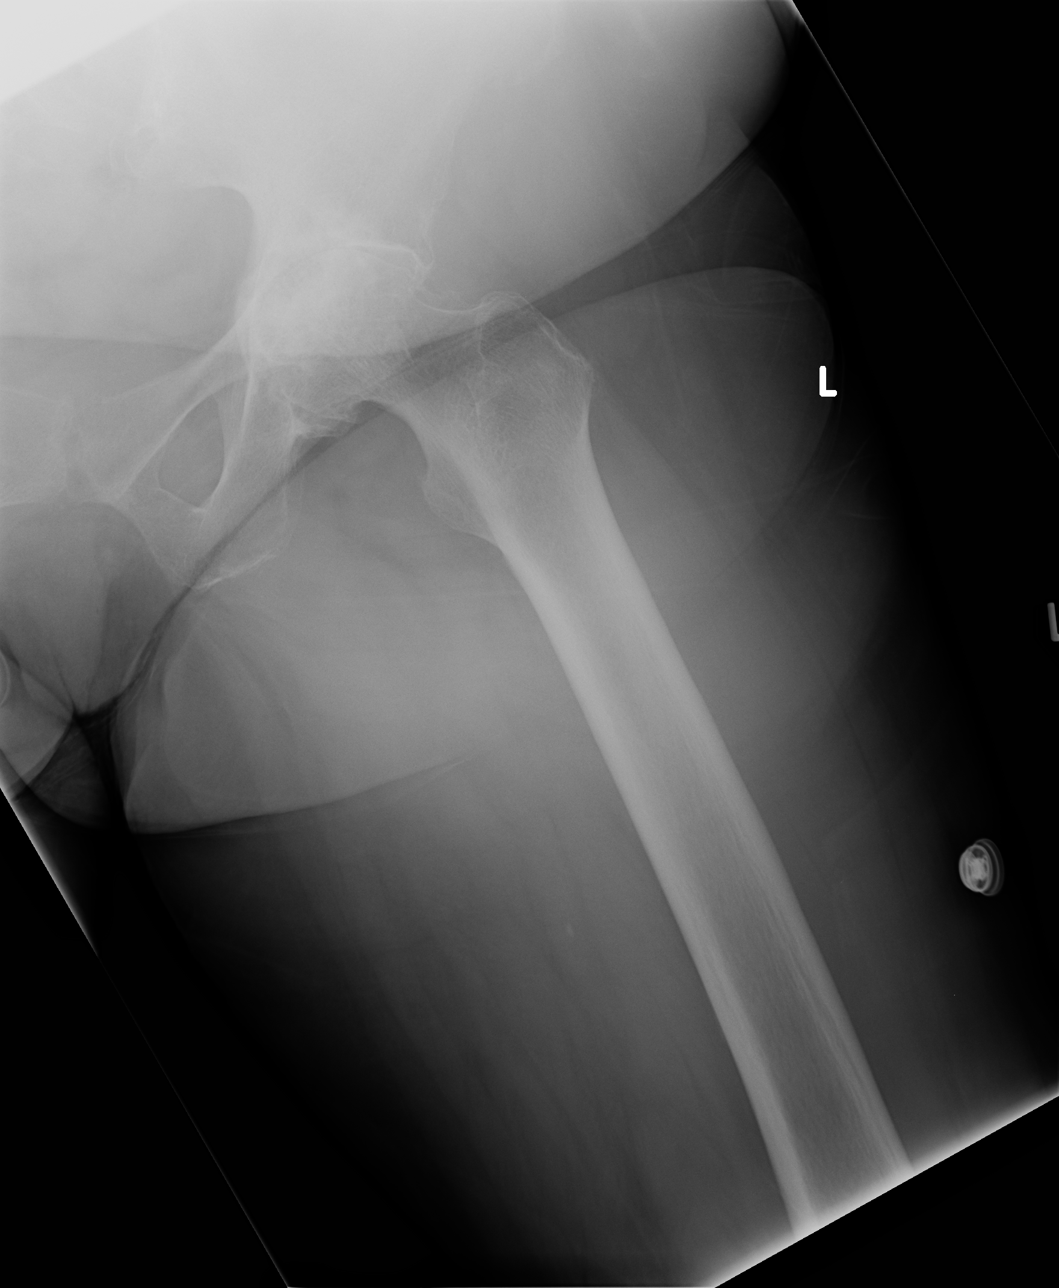

[3 of 3 positions shown; findings below may reference images not displayed]

FINDINGS: Evaluation of the hip is limited due to soft tissue attenuation
secondary to patient's body habitus. No definite acute fracture or
dislocation identified. There is advanced osteoarthritic changes of
the left hip. Right hip arthroplasty.
IMPRESSION: Advanced osteoarthritic changes of the left hip. No acute fracture
or dislocation.

## 2017-12-11 DIAGNOSIS — Z952 Presence of prosthetic heart valve: Secondary | ICD-10-CM | POA: Diagnosis not present

## 2017-12-11 DIAGNOSIS — R0609 Other forms of dyspnea: Secondary | ICD-10-CM | POA: Diagnosis not present

## 2017-12-11 DIAGNOSIS — I2510 Atherosclerotic heart disease of native coronary artery without angina pectoris: Secondary | ICD-10-CM | POA: Diagnosis not present

## 2017-12-11 DIAGNOSIS — I10 Essential (primary) hypertension: Secondary | ICD-10-CM | POA: Diagnosis not present

## 2017-12-11 DIAGNOSIS — R6889 Other general symptoms and signs: Secondary | ICD-10-CM | POA: Diagnosis not present

## 2017-12-26 DIAGNOSIS — R7989 Other specified abnormal findings of blood chemistry: Secondary | ICD-10-CM | POA: Diagnosis not present

## 2017-12-26 DIAGNOSIS — N189 Chronic kidney disease, unspecified: Secondary | ICD-10-CM | POA: Diagnosis not present

## 2017-12-26 DIAGNOSIS — E782 Mixed hyperlipidemia: Secondary | ICD-10-CM | POA: Diagnosis not present

## 2017-12-26 DIAGNOSIS — M109 Gout, unspecified: Secondary | ICD-10-CM | POA: Diagnosis not present

## 2017-12-26 DIAGNOSIS — E875 Hyperkalemia: Secondary | ICD-10-CM | POA: Diagnosis not present

## 2017-12-26 DIAGNOSIS — D649 Anemia, unspecified: Secondary | ICD-10-CM | POA: Diagnosis not present

## 2017-12-26 DIAGNOSIS — I10 Essential (primary) hypertension: Secondary | ICD-10-CM | POA: Diagnosis not present

## 2017-12-30 DIAGNOSIS — I10 Essential (primary) hypertension: Secondary | ICD-10-CM | POA: Diagnosis not present

## 2017-12-30 DIAGNOSIS — E875 Hyperkalemia: Secondary | ICD-10-CM | POA: Diagnosis not present

## 2017-12-30 DIAGNOSIS — Z6832 Body mass index (BMI) 32.0-32.9, adult: Secondary | ICD-10-CM | POA: Diagnosis not present

## 2017-12-30 DIAGNOSIS — M109 Gout, unspecified: Secondary | ICD-10-CM | POA: Diagnosis not present

## 2017-12-30 DIAGNOSIS — E119 Type 2 diabetes mellitus without complications: Secondary | ICD-10-CM | POA: Diagnosis not present

## 2017-12-30 DIAGNOSIS — Z23 Encounter for immunization: Secondary | ICD-10-CM | POA: Diagnosis not present

## 2017-12-30 DIAGNOSIS — D649 Anemia, unspecified: Secondary | ICD-10-CM | POA: Diagnosis not present

## 2018-02-07 ENCOUNTER — Encounter: Payer: Self-pay | Admitting: Podiatry

## 2018-02-07 ENCOUNTER — Ambulatory Visit: Payer: Medicare HMO | Admitting: Podiatry

## 2018-02-07 DIAGNOSIS — E119 Type 2 diabetes mellitus without complications: Secondary | ICD-10-CM

## 2018-02-07 DIAGNOSIS — M79676 Pain in unspecified toe(s): Secondary | ICD-10-CM | POA: Diagnosis not present

## 2018-02-07 DIAGNOSIS — L608 Other nail disorders: Secondary | ICD-10-CM | POA: Diagnosis not present

## 2018-02-07 DIAGNOSIS — B351 Tinea unguium: Secondary | ICD-10-CM

## 2018-02-07 NOTE — Progress Notes (Signed)
Patient ID: Linda Lawson, female   DOB: [birth year], 82 y.o.   MRN: [id number] Complaint:  Visit Type: Patient returns to my office for continued preventative foot care services. Complaint: Patient states" my nails have grown long and thick and become painful to walk and wear shoes" .Patient is a diabetic with no foot complications. The patient presents for preventative foot care services. No changes to ROS  Podiatric Exam: Vascular: dorsalis pedis and posterior tibial pulses are palpable bilateral. Capillary return is immediate. Temperature gradient is WNL. Skin turgor WNL  Sensorium: Absent  Semmes Weinstein monofilament test. Normal tactile sensation bilaterally. Nail Exam: Pt has thick disfigured discolored nails with subungual debris noted bilateral entire nail hallux through fifth toenails Ulcer Exam: There is no evidence of ulcer or pre-ulcerative changes or infection. Orthopedic Exam: Muscle tone and strength are WNL. No limitations in general ROM. No crepitus or effusions noted. Foot type and digits show no abnormalities.  DJD 1st MPJ right. Skin:  Porokeratosis left heel.. No infection or ulcers  Diagnosis:  Onychomycosis, , Pain in right toe, pain in left toes   Treatment & Plan Procedures and Treatment: Consent by patient was obtained for treatment procedures. The patient understood the discussion of treatment and procedures well. All questions were answered thoroughly reviewed. Debridement of mycotic and hypertrophic toenails, 1 through 5 bilateral and clearing of subungual debris. No ulceration, no infection noted. Discussed nail surgery for removal toenail right hallux in future. Return Visit-Office Procedure: Patient instructed to return to the office for a follow up visit 3 months for continued evaluation and treatment.   Helane Gunther DPM

## 2018-02-10 DIAGNOSIS — N19 Unspecified kidney failure: Secondary | ICD-10-CM | POA: Diagnosis not present

## 2018-02-10 DIAGNOSIS — I10 Essential (primary) hypertension: Secondary | ICD-10-CM | POA: Diagnosis not present

## 2018-02-10 DIAGNOSIS — E119 Type 2 diabetes mellitus without complications: Secondary | ICD-10-CM | POA: Diagnosis not present

## 2018-02-10 DIAGNOSIS — R7989 Other specified abnormal findings of blood chemistry: Secondary | ICD-10-CM | POA: Diagnosis not present

## 2018-02-10 DIAGNOSIS — M109 Gout, unspecified: Secondary | ICD-10-CM | POA: Diagnosis not present

## 2018-02-19 DIAGNOSIS — N189 Chronic kidney disease, unspecified: Secondary | ICD-10-CM | POA: Diagnosis not present

## 2018-02-19 DIAGNOSIS — E119 Type 2 diabetes mellitus without complications: Secondary | ICD-10-CM | POA: Diagnosis not present

## 2018-02-19 DIAGNOSIS — M791 Myalgia, unspecified site: Secondary | ICD-10-CM | POA: Diagnosis not present

## 2018-02-19 DIAGNOSIS — M109 Gout, unspecified: Secondary | ICD-10-CM | POA: Diagnosis not present

## 2018-02-19 DIAGNOSIS — I10 Essential (primary) hypertension: Secondary | ICD-10-CM | POA: Diagnosis not present

## 2018-02-19 DIAGNOSIS — E782 Mixed hyperlipidemia: Secondary | ICD-10-CM | POA: Diagnosis not present

## 2018-04-23 ENCOUNTER — Encounter: Payer: Self-pay | Admitting: Podiatry

## 2018-04-23 ENCOUNTER — Ambulatory Visit: Payer: Medicare HMO | Admitting: Podiatry

## 2018-04-23 DIAGNOSIS — M79676 Pain in unspecified toe(s): Secondary | ICD-10-CM | POA: Diagnosis not present

## 2018-04-23 DIAGNOSIS — B351 Tinea unguium: Secondary | ICD-10-CM

## 2018-04-23 DIAGNOSIS — E119 Type 2 diabetes mellitus without complications: Secondary | ICD-10-CM

## 2018-04-23 DIAGNOSIS — L608 Other nail disorders: Secondary | ICD-10-CM

## 2018-04-23 NOTE — Progress Notes (Signed)
Patient ID: Linda Lawson, female   DOB: [birth year], 82 y.o.   MRN: [id number] Complaint:  Visit Type: Patient returns to my office for continued preventative foot care services. Complaint: Patient states" my nails have grown long and thick and become painful to walk and wear shoes" .Patient is a diabetic with no foot complications. The patient presents for preventative foot care services. No changes to ROS  Podiatric Exam: Vascular: dorsalis pedis and posterior tibial pulses are palpable bilateral. Capillary return is immediate. Temperature gradient is WNL. Skin turgor WNL  Sensorium: Absent  Semmes Weinstein monofilament test. Normal tactile sensation bilaterally. Nail Exam: Pt has thick disfigured discolored nails with subungual debris noted bilateral entire nail hallux through fifth toenails Ulcer Exam: There is no evidence of ulcer or pre-ulcerative changes or infection. Orthopedic Exam: Muscle tone and strength are WNL. No limitations in general ROM. No crepitus or effusions noted. Foot type and digits show no abnormalities.  DJD 1st MPJ right. Skin:  Porokeratosis left heel.. No infection or ulcers  Diagnosis:  Onychomycosis, , Pain in right toe, pain in left toes   Treatment & Plan Procedures and Treatment: Consent by patient was obtained for treatment procedures. The patient understood the discussion of treatment and procedures well. All questions were answered thoroughly reviewed. Debridement of mycotic and hypertrophic toenails, 1 through 5 bilateral and clearing of subungual debris. No ulceration, no infection noted.  Return Visit-Office Procedure: Patient instructed to return to the office for a follow up visit 3 months for continued evaluation and treatment.   Helane GuntherGregory Reshonda Koerber DPM

## 2018-04-28 DIAGNOSIS — Z0000 Encounter for general adult medical examination without abnormal findings: Secondary | ICD-10-CM | POA: Diagnosis not present

## 2018-04-28 DIAGNOSIS — Z6832 Body mass index (BMI) 32.0-32.9, adult: Secondary | ICD-10-CM | POA: Diagnosis not present

## 2018-04-28 DIAGNOSIS — Z135 Encounter for screening for eye and ear disorders: Secondary | ICD-10-CM | POA: Diagnosis not present

## 2018-04-28 DIAGNOSIS — Z23 Encounter for immunization: Secondary | ICD-10-CM | POA: Diagnosis not present

## 2018-05-09 ENCOUNTER — Ambulatory Visit (INDEPENDENT_AMBULATORY_CARE_PROVIDER_SITE_OTHER): Payer: Medicare HMO | Admitting: Orthopaedic Surgery

## 2018-05-09 ENCOUNTER — Ambulatory Visit (INDEPENDENT_AMBULATORY_CARE_PROVIDER_SITE_OTHER): Payer: Medicare HMO

## 2018-05-09 DIAGNOSIS — Z96642 Presence of left artificial hip joint: Secondary | ICD-10-CM | POA: Diagnosis not present

## 2018-05-09 DIAGNOSIS — R6889 Other general symptoms and signs: Secondary | ICD-10-CM | POA: Diagnosis not present

## 2018-05-09 NOTE — Progress Notes (Signed)
Office Visit Note   Patient: Linda Lawson           Date of Birth: 04/06/1936           MRN: [id number] Visit Date: 05/09/2018              Requested by: Lewis Moccasinewey, Elizabeth R, MD [address] [place], [place] [address] PCP: Lewis Moccasinewey, Elizabeth R, MD   Assessment & Plan: Visit Diagnoses:  1. Status post left hip replacement     Plan: Impression is 2 years status post left anterior total hip replacement doing well.  At this point, she will continue to work on strengthening exercises.  Follow-up with us in 1 years time for recheck.  May discontinue antibiotic prophylaxis at this time.  Follow-Up Instructions: Return in about 1 year (around 05/10/2019).   Orders:  Orders Placed This Encounter  Procedures  . XR HIP UNILAT W OR W/O PELVIS 2-3 VIEWS LEFT   No orders of the defined types were placed in this encounter.     Procedures: No procedures performed   Clinical Data: No additional findings.   Subjective: Chief Complaint  Patient presents with  . Left Hip - Follow-up    HPI patient is a pleasant 82 year old female who presents to our clinic today 2 years status post left anterior total hip replacement.  She has been doing excellent.  No pain.  She has regained full motion and strength.  She continues to work out at her gym twice a week.  Overall feeling great.  Review of Systems as detailed in HPI.  All others reviewed and are negative.   Objective: Vital Signs: There were no vitals taken for this visit.  Physical Exam well-developed and well-nourished female in no acute distress.  Alert and oriented x3.  Ortho Exam examination of the left hip reveals full motion and strength.  Negative logroll.  She is neurovascularly intact distally.  Specialty Comments:  No specialty comments available.  Imaging: Xr Hip Unilat W Or W/o Pelvis 2-3 Views Left  Result Date: 05/09/2018 Stable alignment of the left hip prosthesis    PMFS History: Patient Active  Problem List   Diagnosis Date Noted  . Left hip pain 02/28/2017  . Abnormal electrocardiogram (ECG) (EKG) 08/26/2016  . Premature atrial contractions - asymptomatic 08/24/2016  . Essential hypertension   . Hip joint replacement status 04/19/2016  . Primary osteoarthritis of left hip   . Status post aortic valve replacement with bioprosthetic valve 08/24/2008   Past Medical History:  Diagnosis Date  . Arthritis   . Complication of anesthesia   . Depression    situatinal  . Diabetes mellitus without complication (HCC)    Type II  . Essential hypertension   . Gout   . Hip joint replacement status   . History of aortic valvular stenosis - s/p AVR 2010   s/p AVR (tissue) 2010; normal valve function by echo in 2016  . Hyperlipidemia   . Pneumonia 2010  . PONV (postoperative nausea and vomiting)   . Premature atrial contractions - asymptomatic 08/24/2016  . Primary osteoarthritis of left hip   . Status post aortic valve replacement with bioprosthetic valve 08/24/2008    Family History  Problem Relation Age of Onset  . Diabetes Maternal Grandmother   . Heart disease Maternal Grandmother   . Diabetes Sister     Past Surgical History:  Procedure Laterality Date  . ABDOMINAL HYSTERECTOMY    . COLONOSCOPY    .  HIP ARTHROPLASTY Right 2002  . JOINT REPLACEMENT    . KNEE ARTHROPLASTY Left 2012  . pericardial tissue heart valve  2010   AVR  . TOTAL HIP ARTHROPLASTY Left 04/19/2016  . TOTAL HIP ARTHROPLASTY Left 04/19/2016   Procedure: LEFT TOTAL HIP ARTHROPLASTY ANTERIOR APPROACH;  Surgeon: Tarry KosNaiping M , MD;  Location: [facility] OR;  Service: Orthopedics;  Laterality: Left;   Social History   Occupational History  . Not on file  Tobacco Use  . Smoking status: Former Smoker    Years: 20.00    Last attempt to quit: 06/12/2008    Years since quitting: 9.9  . Smokeless tobacco: Never Used  Substance and Sexual Activity  . Alcohol use: No  . Drug use: No  . Sexual activity: Not on file

## 2018-05-28 DIAGNOSIS — E119 Type 2 diabetes mellitus without complications: Secondary | ICD-10-CM | POA: Diagnosis not present

## 2018-05-28 DIAGNOSIS — R7989 Other specified abnormal findings of blood chemistry: Secondary | ICD-10-CM | POA: Diagnosis not present

## 2018-05-28 DIAGNOSIS — M109 Gout, unspecified: Secondary | ICD-10-CM | POA: Diagnosis not present

## 2018-05-28 DIAGNOSIS — M791 Myalgia, unspecified site: Secondary | ICD-10-CM | POA: Diagnosis not present

## 2018-05-28 DIAGNOSIS — N19 Unspecified kidney failure: Secondary | ICD-10-CM | POA: Diagnosis not present

## 2018-05-30 DIAGNOSIS — M791 Myalgia, unspecified site: Secondary | ICD-10-CM | POA: Diagnosis not present

## 2018-05-30 DIAGNOSIS — Z6832 Body mass index (BMI) 32.0-32.9, adult: Secondary | ICD-10-CM | POA: Diagnosis not present

## 2018-05-30 DIAGNOSIS — N189 Chronic kidney disease, unspecified: Secondary | ICD-10-CM | POA: Diagnosis not present

## 2018-05-30 DIAGNOSIS — E1122 Type 2 diabetes mellitus with diabetic chronic kidney disease: Secondary | ICD-10-CM | POA: Diagnosis not present

## 2018-05-30 DIAGNOSIS — E782 Mixed hyperlipidemia: Secondary | ICD-10-CM | POA: Diagnosis not present

## 2018-05-30 DIAGNOSIS — E119 Type 2 diabetes mellitus without complications: Secondary | ICD-10-CM | POA: Diagnosis not present

## 2018-05-30 DIAGNOSIS — M109 Gout, unspecified: Secondary | ICD-10-CM | POA: Diagnosis not present

## 2018-06-04 IMAGING — RF DG HIP (WITH PELVIS) OPERATIVE*L*
1 series · 1 of 1 positions shown · non-contrast
Comparison: Pelvis and left hip films of 10/24/2015

CLINICAL DATA: Left total hip replacement

EXAM:
OPERATIVE left HIP (WITH PELVIS IF PERFORMED) one VIEW
TECHNIQUE: Fluoroscopic spot image(s) were submitted for interpretation
post-operatively.

[Series 1: run · 1 of 1 slices shown]
[im 1/1]
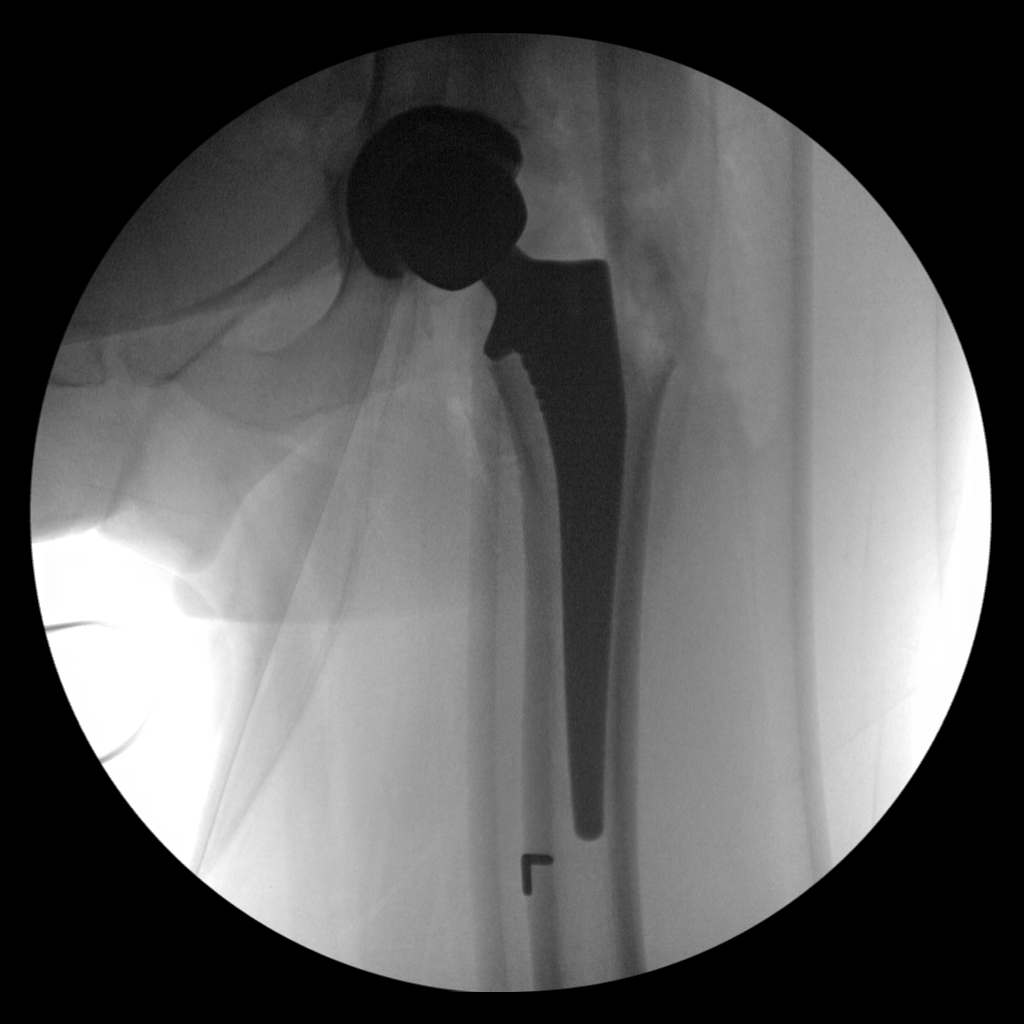

[1 of 1 positions shown; findings below may reference images not displayed]

FINDINGS: A single C-arm spot film shows the acetabular and femoral components
of the left hip replacement to be in good position. No fracture is
seen.
IMPRESSION: Left total hip replacement components in good position with no
complicating features.

## 2018-06-27 DIAGNOSIS — M2550 Pain in unspecified joint: Secondary | ICD-10-CM | POA: Diagnosis not present

## 2018-06-27 DIAGNOSIS — M6282 Rhabdomyolysis: Secondary | ICD-10-CM | POA: Diagnosis not present

## 2018-06-27 DIAGNOSIS — E782 Mixed hyperlipidemia: Secondary | ICD-10-CM | POA: Diagnosis not present

## 2018-06-27 DIAGNOSIS — M109 Gout, unspecified: Secondary | ICD-10-CM | POA: Diagnosis not present

## 2018-07-02 DIAGNOSIS — E119 Type 2 diabetes mellitus without complications: Secondary | ICD-10-CM | POA: Diagnosis not present

## 2018-07-02 DIAGNOSIS — M791 Myalgia, unspecified site: Secondary | ICD-10-CM | POA: Diagnosis not present

## 2018-07-02 DIAGNOSIS — M109 Gout, unspecified: Secondary | ICD-10-CM | POA: Diagnosis not present

## 2018-07-02 DIAGNOSIS — E782 Mixed hyperlipidemia: Secondary | ICD-10-CM | POA: Diagnosis not present

## 2018-07-02 DIAGNOSIS — R011 Cardiac murmur, unspecified: Secondary | ICD-10-CM | POA: Diagnosis not present

## 2018-07-23 ENCOUNTER — Ambulatory Visit: Payer: Medicare HMO | Admitting: Podiatry

## 2018-08-06 ENCOUNTER — Telehealth (INDEPENDENT_AMBULATORY_CARE_PROVIDER_SITE_OTHER): Payer: Medicare HMO | Admitting: Cardiology

## 2018-08-06 ENCOUNTER — Encounter: Payer: Self-pay | Admitting: Cardiology

## 2018-08-06 DIAGNOSIS — R0600 Dyspnea, unspecified: Secondary | ICD-10-CM | POA: Diagnosis not present

## 2018-08-06 DIAGNOSIS — E785 Hyperlipidemia, unspecified: Secondary | ICD-10-CM | POA: Diagnosis not present

## 2018-08-06 DIAGNOSIS — I491 Atrial premature depolarization: Secondary | ICD-10-CM | POA: Diagnosis not present

## 2018-08-06 DIAGNOSIS — Z953 Presence of xenogenic heart valve: Secondary | ICD-10-CM

## 2018-08-06 DIAGNOSIS — I10 Essential (primary) hypertension: Secondary | ICD-10-CM | POA: Diagnosis not present

## 2018-08-06 DIAGNOSIS — R0609 Other forms of dyspnea: Secondary | ICD-10-CM | POA: Insufficient documentation

## 2018-08-06 NOTE — Assessment & Plan Note (Addendum)
Per her, her blood pressure has been somewhat labile.  Been followed by PCP, and she does not have her log active.  Had recommended in the past not using AV nodal agents because of bradycardia issues.  I do not see that she is currently on any medications for this based on her most recent doctor visit with PCP, however I have losartan-HCTZ listed.  We will need to review her medications on follow-up, along with blood pressure log.Linda Lawson

## 2018-08-06 NOTE — Assessment & Plan Note (Signed)
Not currently on any medications.  Statin was held because of elevated CK levels.  I doubt that this is related to statin since CK continues to be elevated. Last clinic visit with Dr. do we suggested potentially being prescribed Repatha.  However I did not see that this was actually done.  LDL is 130.  Would like to see it less than 100 because of her aortic valve disease, but allowing time for her labs to normalize out.

## 2018-08-06 NOTE — Assessment & Plan Note (Addendum)
Relatively asymptomatic PACs from her standpoint.  Chronic.  Not currently on a beta-blocker.  Would probably not treat unless symptomatic.

## 2018-08-06 NOTE — Patient Instructions (Signed)
Continue to monitor home blood pressures and symptoms of shortness of breath.  We will contact you for follow-up appointment in roughly 2 to 3 months.

## 2018-08-06 NOTE — Assessment & Plan Note (Signed)
This seems to be some this been ongoing with her for about the last 6 months or so.  Not on percent sure what to make of this, because she still is doing exercise relatively routinely.  It does not seem to be an acute change although she says that about 6 months ago, however she did say that the symptoms came on "all of a sudden". There is nothing suggesting this symptom in her PCPs notes.  As it does not seem to be urgent symptom, and she seemed to think it was more related to deconditioning and being 83 years old, we will simply reassess in close follow-up. She did have a relatively normal echocardiogram and stress test last year for exertional dyspnea, therefore I am reluctant to jump to ordering new studies based on her current COVID-19 restrictions.

## 2018-08-06 NOTE — Progress Notes (Signed)
Virtual Visit via Telephone Note    Evaluation Performed:  Follow-up visit  This visit type was conducted due to national recommendations for restrictions regarding the COVID-19 Pandemic (e.g. social distancing).  This format is felt to be most appropriate for this patient at this time.  All issues noted in this document were discussed and addressed.  No physical exam was performed (except for noted visual exam findings with Video Visits).  Please refer to the patient's chart (MyChart message for video visits and phone note for telephone visits) for the patient's consent to telehealth for [facility].  Date:  08/06/2018   ID:  Linda Lawson, DOB [birth year], MRN [id number]  Patient Location:  [address] [place] [address]   Provider location:   [place], [place] - [facility]  PCP:  [name], MD  Cardiologist:  No primary care provider on file.  Patient is indicated that she is wanting to switch to a local cardiologist in [place] from Dr. Beverely Pace and [facility] Electrophysiologist:  None   Chief Complaint: Annual follow-up, shortness of breath on exertion  History of Present Illness:    Linda Lawson is a 83 y.o. female who presents via audio/video conferencing for a telehealth visit today.  Linda Lawson has a cardiac history as follows:  Bioprosthetic aortic valve replacement 2010  Preop showed severe stenosis, but no significant CAD. 2010  Frequent PACs  Linda Lawson was seen by me for preop evaluation on August 24, 2016.  However she has been followed routinely by Dr. Heide Scales at [facility], last seen in July 2019 as part of a follow-up for exertional dyspnea. --Her exertional dyspnea has been evaluated with an echocardiogram and Myoview (see below) that were relatively unremarkable.  Her symptoms had been improving at the time of her follow-up visit and she was advised to continue with her aerobic exercise follow-up in a year.  No medication  changes were made.  The patient does not symptoms concerning for COVID-19 infection (fever, chills, cough, or new SHORTNESS OF BREATH).  Her shortness of breath lasting for the last 6 months.  Interval History: She indicates to me that over the last 6 months or so she has been noticing mildly worsening exertional dyspnea but no chest pain or pressure.  She notes having dyspnea when she is at the gym and tries to  [facility] [employer], or when she is walking fast upstairs.  Not with routine exercise or walking.  This is not associated with any chest pain or pressure.  Sometimes she notes having to stop to catch her breath while walking up the stairs quickly, but is unable to could continue without any issues. She denies any associated PND, orthopnea or edema.  She also denies any significant rapid irregular heartbeats or palpitations.  She says that occasionally her heart rate will go up but only last about a minute at a time and nothing prolonged or irregular. No syncope/near syncope or TIA shows amaurosis fugax.  No melena, hematochezia, hematuria or epistaxis.   Prior CV studies:   The following studies were reviewed today (from care everywhere): --PSH updated  Transthoracic Echo December 04, 2017 [facility]): Normal LV size and function with no segmental wall motion abnormality.  Mild concentric LVH.  EF 60 to 65%.  Normally functioning bioprosthetic aortic valve with only mild gradient (mean gradient 15 mmHg).  Mild MR.   Cardiolite Nuclear Stress Test October 28, 2017 [facility]): EF 70%.  No ischemia or infarction.  Past  Medical History:  Diagnosis Date  . Arthritis   . Complication of anesthesia   . Depression    situatinal  . Diabetes mellitus without complication (HCC)    Type II  . Essential hypertension   . Gout   . Hip joint replacement status   . History of aortic valvular stenosis - s/p AVR 2010   s/p AVR (tissue) 2010; normal valve function by echo in 2016  . Hyperlipidemia    . Pneumonia 2010  . PONV (postoperative nausea and vomiting)   . Premature atrial contractions - asymptomatic 08/24/2016  . Primary osteoarthritis of left hip   . Status post aortic valve replacement with bioprosthetic valve 08/24/2008   Past Surgical History:  Procedure Laterality Date  . ABDOMINAL HYSTERECTOMY    . AORTIC VALVE REPLACEMENT  2010   Bioprosthetic AVR [facility])  . CARDIOLITE NUCLEAR STRESS TEST  10/28/2017    [facility]): EF 70%.  No ischemia or infarction.  . COLONOSCOPY    . HIP ARTHROPLASTY Right 2002  . JOINT REPLACEMENT    . KNEE ARTHROPLASTY Left 2012  . LEFT HEART CATH AND CORONARY ANGIOGRAPHY  2010   Nonobstructive CAD as part of preop AVR surgery  . TOTAL HIP ARTHROPLASTY Left 04/19/2016   Procedure: LEFT TOTAL HIP ARTHROPLASTY ANTERIOR APPROACH;  Surgeon: Tarry Kos, MD;  Location: [facility];  Service: Orthopedics;  Laterality: Left;  . TRANSTHORACIC ECHOCARDIOGRAM  12/04/2017   [facility]): Normal LV size and function with no segmental wall motion abnormality.  Mild concentric LVH.  EF 60 to 65%.  Normally functioning bioprosthetic aortic valve with only mild gradient (mean gradient 15 mmHg).  Mild MR.     Current Outpatient Medications on File Prior to Visit  Medication Sig Dispense Refill  . ACCU-CHEK AVIVA PLUS test strip     . ACCU-CHEK SOFTCLIX LANCETS lancets     . Alcohol Swabs (B-D SINGLE USE SWABS REGULAR) PADS     . allopurinol (ZYLOPRIM) 100 MG tablet Take 100 mg by mouth daily.     Marland Kitchen aspirin EC 325 MG tablet Take 1 tablet (325 mg total) by mouth 2 (two) times daily. 84 tablet 0  . Aspirin-Calcium Carbonate 81-777 MG TABS Take by mouth.    . B-D UF III MINI PEN NEEDLES 31G X 5 MM MISC     . diclofenac sodium (VOLTAREN) 1 % GEL     . LANTUS SOLOSTAR 100 UNIT/ML Solostar Pen Inject 10 Units into the skin daily.     Marland Kitchen loratadine (CLARITIN) 10 MG tablet Take 10 mg by mouth daily as needed for allergies or rhinitis.    Marland Kitchen  losartan-hydrochlorothiazide (HYZAAR) 100-12.5 MG tablet     . methocarbamol (ROBAXIN) 500 MG tablet Take 1 tablet (500 mg total) by mouth every 6 (six) hours as needed for muscle spasms. 30 tablet 2  . TRADJENTA 5 MG TABS tablet Take 5 mg by mouth daily with breakfast.      No current facility-administered medications on file prior to visit.     Allergies:   Penicillins and Codeine   Social History   Tobacco Use  . Smoking status: Former Smoker    Years: 20.00    Last attempt to quit: 06/12/2008    Years since quitting: 10.1  . Smokeless tobacco: Never Used  Substance Use Topics  . Alcohol use: No  . Drug use: No     Family Hx: The patient's family history includes Diabetes in her maternal  grandmother and sister; Heart disease in her maternal grandmother.  ROS:   Please see the history of present illness.     Review of Systems  Constitutional: Negative for chills, fever and malaise/fatigue.  HENT: Negative for congestion.   Respiratory: Positive for shortness of breath (Per HPI). Negative for cough.   Gastrointestinal: Negative for blood in stool and melena.  Genitourinary: Negative for hematuria.  Musculoskeletal: Negative for joint pain.  Neurological: Negative for dizziness, focal weakness, weakness and headaches.    Labs/Other Tests and Data Reviewed:    Recent Labs: No results found for requested labs within last 8760 hours.   Recent Labs: February 11, 2018  Na+ 140, K+ 5.1, Cl- 108, HCO3-24, BUN 19, Cr 1.13, Glu 122, Ca2+ 9.6; AST 23, ALT 16, AlkP 75; --uric acid 6.0.  CK 271; A1c 7.2  TC 195, TG 84, HDL 54, LDL 124 (statin discontinued due to elevated CK)  May 29, 2018  Na+ 138, K+ 5.4, Cl- 105, HCO3-22, BUN 26, Cr 1.26, Glu 130, Ca2+ 9.7; AST 15, ALT 19, AlkP 79; --Uric acid 7.5, CK 295, A1c 7.5   06/28/2018:   TC 204, TG 95, HDL 55, LDL 130  Na+ 138, K+ 5.2, Cl- 106, HCO3-20, BUN 25, Cr 1.24, Glu 140, Ca2+ 9.7 uric acid 8.1, CK 343  CBC: W 6.0,  H/H 11.3/34.8, Plt 238   Recent Lipid Panel No results found for: CHOL, TRIG, HDL, CHOLHDL, LDLCALC, LDLDIRECT  Wt Readings from Last 3 Encounters:  08/24/16 185 lb (83.9 kg)  05/04/16 187 lb (84.8 kg)  04/24/16 187 lb (84.8 kg)     Exam:    Vital Signs:  There were no vitals taken for this visit.   Well nourished, well developed female in no acute distress. Alert and oriented x3. Normal/Pleasant mood and affect.  ASSESSMENT & PLAN:    Problem List Items Addressed This Visit    DOE (dyspnea on exertion)    This seems to be some this been ongoing with her for about the last 6 months or so.  Not on percent sure what to make of this, because she still is doing exercise relatively routinely.  It does not seem to be an acute change although she says that about 6 months ago, however she did say that the symptoms came on "all of a sudden". There is nothing suggesting this symptom in her PCPs notes.  As it does not seem to be urgent symptom, and she seemed to think it was more related to deconditioning and being 83 years old, we will simply reassess in close follow-up. She did have a relatively normal echocardiogram and stress test last year for exertional dyspnea, therefore I am reluctant to jump to ordering new studies based on her current COVID-19 restrictions.      Essential hypertension (Chronic)    Per her, her blood pressure has been somewhat labile.  Been followed by PCP, and she does not have her log active.  Had recommended in the past not using AV nodal agents because of bradycardia issues.  I do not see that she is currently on any medications for this based on her most recent doctor visit with PCP, however I have losartan-HCTZ listed.  We will need to review her medications on follow-up, along with blood pressure log.Marland Kitchen      Hyperlipidemia LDL goal <100 (Chronic)    Not currently on any medications.  Statin was held because of elevated CK levels.  I doubt that this  is  related to statin since CK continues to be elevated. Last clinic visit with Dr. do we suggested potentially being prescribed Repatha.  However I did not see that this was actually done.  LDL is 130.  Would like to see it less than 100 because of her aortic valve disease, but allowing time for her labs to normalize out.      Premature atrial contractions - asymptomatic (Chronic)    Relatively asymptomatic PACs from her standpoint.  Chronic.  Not currently on a beta-blocker.  Would probably not treat unless symptomatic.      Status post aortic valve replacement with bioprosthetic valve (Chronic)    Most recent echo in June 2019 showed normal functioning valve. Would not routinely follow-up again until 2021, however with her complaining of exertional dyspnea, may want to reevaluate if symptoms continue up into the next follow-up visit.  Not currently ordering nonemergent/emergent noninvasive evaluations due to COVID-19 restrictions         COVID-19 Education: The signs and symptoms of COVID-19 were discussed with the patient and how to seek care for testing (follow up with PCP or arrange E-visit).  The importance of social distancing was discussed today.  Patient Risk:   After full review of this patients clinical status, I feel that they are at least moderate risk at this time.  Time:   Today, I have spent 15 minutes with the patient with telehealth technology discussing restrictions for COVID-19 and need to potentially reschedule visit.  We discussed her symptoms and plans to review any change in symptoms over the next 2 months.  I will see her back at that time to reassess symptoms.  We also indicated that if her symptoms were to worsen, then we should have her come in for urgent evaluation. Also I spent an additional 15 to 20 minutes on chart review of outside records from PCP clinic notes, laboratory studies as well as care everywhere for her prior cardiology visits and studies  reviewed above.   Medication Adjustments/Labs and Tests Ordered: Current medicines are reviewed at length with the patient today.  Concerns regarding medicines are outlined above.  Tests Ordered: No orders of the defined types were placed in this encounter.  Medication Changes: No orders of the defined types were placed in this encounter.   Disposition:  in a few month(s)  Signed, Bryan Lemmaavid , MD  08/06/2018 6:32 PM    [facility]

## 2018-08-06 NOTE — Assessment & Plan Note (Signed)
Most recent echo in June 2019 showed normal functioning valve. Would not routinely follow-up again until 2021, however with her complaining of exertional dyspnea, may want to reevaluate if symptoms continue up into the next follow-up visit.  Not currently ordering nonemergent/emergent noninvasive evaluations due to COVID-19 restrictions

## 2018-08-07 ENCOUNTER — Ambulatory Visit: Payer: Medicare HMO | Admitting: Cardiology

## 2018-08-12 DIAGNOSIS — Z719 Counseling, unspecified: Secondary | ICD-10-CM | POA: Diagnosis not present

## 2018-08-12 DIAGNOSIS — I10 Essential (primary) hypertension: Secondary | ICD-10-CM | POA: Diagnosis not present

## 2018-09-05 DIAGNOSIS — E782 Mixed hyperlipidemia: Secondary | ICD-10-CM | POA: Diagnosis not present

## 2018-09-05 DIAGNOSIS — I10 Essential (primary) hypertension: Secondary | ICD-10-CM | POA: Diagnosis not present

## 2018-09-05 DIAGNOSIS — E119 Type 2 diabetes mellitus without complications: Secondary | ICD-10-CM | POA: Diagnosis not present

## 2018-09-05 DIAGNOSIS — M109 Gout, unspecified: Secondary | ICD-10-CM | POA: Diagnosis not present

## 2018-11-28 DIAGNOSIS — R6889 Other general symptoms and signs: Secondary | ICD-10-CM | POA: Diagnosis not present

## 2018-12-03 DIAGNOSIS — E782 Mixed hyperlipidemia: Secondary | ICD-10-CM | POA: Diagnosis not present

## 2018-12-03 DIAGNOSIS — N19 Unspecified kidney failure: Secondary | ICD-10-CM | POA: Diagnosis not present

## 2018-12-03 DIAGNOSIS — E119 Type 2 diabetes mellitus without complications: Secondary | ICD-10-CM | POA: Diagnosis not present

## 2018-12-03 DIAGNOSIS — R7989 Other specified abnormal findings of blood chemistry: Secondary | ICD-10-CM | POA: Diagnosis not present

## 2018-12-03 DIAGNOSIS — I10 Essential (primary) hypertension: Secondary | ICD-10-CM | POA: Diagnosis not present

## 2018-12-03 DIAGNOSIS — E875 Hyperkalemia: Secondary | ICD-10-CM | POA: Diagnosis not present

## 2018-12-03 DIAGNOSIS — M109 Gout, unspecified: Secondary | ICD-10-CM | POA: Diagnosis not present

## 2018-12-03 DIAGNOSIS — N189 Chronic kidney disease, unspecified: Secondary | ICD-10-CM | POA: Diagnosis not present

## 2018-12-08 DIAGNOSIS — E119 Type 2 diabetes mellitus without complications: Secondary | ICD-10-CM | POA: Diagnosis not present

## 2018-12-08 DIAGNOSIS — M109 Gout, unspecified: Secondary | ICD-10-CM | POA: Diagnosis not present

## 2018-12-08 DIAGNOSIS — I10 Essential (primary) hypertension: Secondary | ICD-10-CM | POA: Diagnosis not present

## 2018-12-08 DIAGNOSIS — E782 Mixed hyperlipidemia: Secondary | ICD-10-CM | POA: Diagnosis not present

## 2018-12-25 DIAGNOSIS — I10 Essential (primary) hypertension: Secondary | ICD-10-CM | POA: Diagnosis not present

## 2018-12-25 DIAGNOSIS — Z952 Presence of prosthetic heart valve: Secondary | ICD-10-CM | POA: Diagnosis not present

## 2018-12-25 DIAGNOSIS — R6889 Other general symptoms and signs: Secondary | ICD-10-CM | POA: Diagnosis not present

## 2018-12-25 DIAGNOSIS — I2510 Atherosclerotic heart disease of native coronary artery without angina pectoris: Secondary | ICD-10-CM | POA: Diagnosis not present

## 2019-01-08 DIAGNOSIS — R42 Dizziness and giddiness: Secondary | ICD-10-CM | POA: Diagnosis not present

## 2019-01-08 DIAGNOSIS — E119 Type 2 diabetes mellitus without complications: Secondary | ICD-10-CM | POA: Diagnosis not present

## 2019-01-08 DIAGNOSIS — N189 Chronic kidney disease, unspecified: Secondary | ICD-10-CM | POA: Diagnosis not present

## 2019-01-22 DIAGNOSIS — E119 Type 2 diabetes mellitus without complications: Secondary | ICD-10-CM | POA: Diagnosis not present

## 2019-01-22 DIAGNOSIS — E782 Mixed hyperlipidemia: Secondary | ICD-10-CM | POA: Diagnosis not present

## 2019-01-22 DIAGNOSIS — I10 Essential (primary) hypertension: Secondary | ICD-10-CM | POA: Diagnosis not present

## 2019-02-05 DIAGNOSIS — Z23 Encounter for immunization: Secondary | ICD-10-CM | POA: Diagnosis not present

## 2019-02-17 DIAGNOSIS — E875 Hyperkalemia: Secondary | ICD-10-CM | POA: Diagnosis not present

## 2019-02-17 DIAGNOSIS — E782 Mixed hyperlipidemia: Secondary | ICD-10-CM | POA: Diagnosis not present

## 2019-02-17 DIAGNOSIS — I10 Essential (primary) hypertension: Secondary | ICD-10-CM | POA: Diagnosis not present

## 2019-02-17 DIAGNOSIS — E119 Type 2 diabetes mellitus without complications: Secondary | ICD-10-CM | POA: Diagnosis not present

## 2019-02-17 DIAGNOSIS — N19 Unspecified kidney failure: Secondary | ICD-10-CM | POA: Diagnosis not present

## 2019-02-20 DIAGNOSIS — M19011 Primary osteoarthritis, right shoulder: Secondary | ICD-10-CM | POA: Diagnosis not present

## 2019-02-20 DIAGNOSIS — K5900 Constipation, unspecified: Secondary | ICD-10-CM | POA: Diagnosis not present

## 2019-02-20 DIAGNOSIS — E119 Type 2 diabetes mellitus without complications: Secondary | ICD-10-CM | POA: Diagnosis not present

## 2019-02-20 DIAGNOSIS — I10 Essential (primary) hypertension: Secondary | ICD-10-CM | POA: Diagnosis not present

## 2019-03-17 DIAGNOSIS — E119 Type 2 diabetes mellitus without complications: Secondary | ICD-10-CM | POA: Diagnosis not present

## 2019-03-17 DIAGNOSIS — N19 Unspecified kidney failure: Secondary | ICD-10-CM | POA: Diagnosis not present

## 2019-03-20 DIAGNOSIS — I10 Essential (primary) hypertension: Secondary | ICD-10-CM | POA: Diagnosis not present

## 2019-03-20 DIAGNOSIS — E119 Type 2 diabetes mellitus without complications: Secondary | ICD-10-CM | POA: Diagnosis not present

## 2019-03-20 DIAGNOSIS — N19 Unspecified kidney failure: Secondary | ICD-10-CM | POA: Diagnosis not present

## 2019-03-20 DIAGNOSIS — M19011 Primary osteoarthritis, right shoulder: Secondary | ICD-10-CM | POA: Diagnosis not present

## 2019-05-06 ENCOUNTER — Ambulatory Visit: Payer: Self-pay | Admitting: Orthopaedic Surgery

## 2019-05-13 DIAGNOSIS — E119 Type 2 diabetes mellitus without complications: Secondary | ICD-10-CM | POA: Diagnosis not present

## 2019-06-16 DIAGNOSIS — E119 Type 2 diabetes mellitus without complications: Secondary | ICD-10-CM | POA: Diagnosis not present

## 2019-06-16 DIAGNOSIS — N19 Unspecified kidney failure: Secondary | ICD-10-CM | POA: Diagnosis not present

## 2019-06-19 DIAGNOSIS — E119 Type 2 diabetes mellitus without complications: Secondary | ICD-10-CM | POA: Diagnosis not present

## 2019-06-19 DIAGNOSIS — M179 Osteoarthritis of knee, unspecified: Secondary | ICD-10-CM | POA: Diagnosis not present

## 2019-06-19 DIAGNOSIS — M7072 Other bursitis of hip, left hip: Secondary | ICD-10-CM | POA: Diagnosis not present

## 2019-06-19 DIAGNOSIS — I10 Essential (primary) hypertension: Secondary | ICD-10-CM | POA: Diagnosis not present

## 2019-06-19 DIAGNOSIS — K5900 Constipation, unspecified: Secondary | ICD-10-CM | POA: Diagnosis not present

## 2019-06-29 DIAGNOSIS — R6889 Other general symptoms and signs: Secondary | ICD-10-CM | POA: Diagnosis not present

## 2019-06-29 DIAGNOSIS — Z952 Presence of prosthetic heart valve: Secondary | ICD-10-CM | POA: Diagnosis not present

## 2019-06-29 DIAGNOSIS — I081 Rheumatic disorders of both mitral and tricuspid valves: Secondary | ICD-10-CM | POA: Diagnosis not present

## 2019-07-05 ENCOUNTER — Ambulatory Visit: Payer: Medicare HMO | Attending: Internal Medicine

## 2019-07-05 DIAGNOSIS — Z23 Encounter for immunization: Secondary | ICD-10-CM | POA: Insufficient documentation

## 2019-07-05 NOTE — Progress Notes (Signed)
   Covid-19 Vaccination Clinic  Name:  Linda [facility]    MRN: [id number] DOB: 17-Nov-1935  07/05/2019  [name] was observed post Covid-19 immunization for 15 minutes without incidence. She was provided with Vaccine Information Sheet and instruction to access the V-Safe system.   [name] was instructed to call 911 with any severe reactions post vaccine: Marland Kitchen Difficulty breathing  . Swelling of your face and throat  . A fast heartbeat  . A bad rash all over your body  . Dizziness and weakness    Immunizations Administered    Name Date Dose VIS Date Route   Pfizer COVID-19 Vaccine 07/05/2019  2:43 PM 0.3 mL 04/24/2019 Intramuscular   Manufacturer: [employer], [employer]   Lot: [id number]   NDC: [address]-9

## 2019-07-21 DIAGNOSIS — K5900 Constipation, unspecified: Secondary | ICD-10-CM | POA: Diagnosis not present

## 2019-07-21 DIAGNOSIS — H11003 Unspecified pterygium of eye, bilateral: Secondary | ICD-10-CM | POA: Diagnosis not present

## 2019-07-21 DIAGNOSIS — E119 Type 2 diabetes mellitus without complications: Secondary | ICD-10-CM | POA: Diagnosis not present

## 2019-07-21 DIAGNOSIS — Z0000 Encounter for general adult medical examination without abnormal findings: Secondary | ICD-10-CM | POA: Diagnosis not present

## 2019-07-29 ENCOUNTER — Ambulatory Visit: Payer: Medicare HMO | Attending: Internal Medicine

## 2019-07-29 DIAGNOSIS — Z23 Encounter for immunization: Secondary | ICD-10-CM

## 2019-07-29 NOTE — Progress Notes (Signed)
   Covid-19 Vaccination Clinic  Name:  Linda Lawson    MRN: [id number] DOB: 01-31-1936  07/29/2019  [name] was observed post Covid-19 immunization for 15 minutes without incident. She was provided with Vaccine Information Sheet and instruction to access the V-Safe system.   [name] was instructed to call 911 with any severe reactions post vaccine: Marland Kitchen Difficulty breathing  . Swelling of face and throat  . A fast heartbeat  . A bad rash all over body  . Dizziness and weakness   Immunizations Administered    Name Date Dose VIS Date Route   Pfizer COVID-19 Vaccine 07/29/2019  9:11 AM 0.3 mL 04/24/2019 Intramuscular   Manufacturer: [employer], [employer]   Lot: [id number]   NDC: [address]-2

## 2019-08-24 DIAGNOSIS — H5203 Hypermetropia, bilateral: Secondary | ICD-10-CM | POA: Diagnosis not present

## 2019-08-24 DIAGNOSIS — E119 Type 2 diabetes mellitus without complications: Secondary | ICD-10-CM | POA: Diagnosis not present

## 2019-08-24 DIAGNOSIS — H2513 Age-related nuclear cataract, bilateral: Secondary | ICD-10-CM | POA: Diagnosis not present

## 2019-08-24 DIAGNOSIS — R6889 Other general symptoms and signs: Secondary | ICD-10-CM | POA: Diagnosis not present

## 2019-09-01 ENCOUNTER — Ambulatory Visit: Payer: Medicare HMO | Admitting: Podiatry

## 2019-09-01 ENCOUNTER — Encounter: Payer: Self-pay | Admitting: Podiatry

## 2019-09-01 ENCOUNTER — Other Ambulatory Visit: Payer: Self-pay

## 2019-09-01 VITALS — Temp 96.8°F

## 2019-09-01 DIAGNOSIS — M79676 Pain in unspecified toe(s): Secondary | ICD-10-CM | POA: Diagnosis not present

## 2019-09-01 DIAGNOSIS — B351 Tinea unguium: Secondary | ICD-10-CM | POA: Diagnosis not present

## 2019-09-01 DIAGNOSIS — L608 Other nail disorders: Secondary | ICD-10-CM | POA: Diagnosis not present

## 2019-09-01 DIAGNOSIS — E119 Type 2 diabetes mellitus without complications: Secondary | ICD-10-CM | POA: Diagnosis not present

## 2019-09-01 NOTE — Progress Notes (Signed)
This patient returns to my office for at risk foot care.  This patient requires this care by a professional since this patient will be at risk due to having diabetes.   This patient is unable to cut nails herself since the patient cannot reach her nails.These nails are painful walking and wearing shoes.  This patient presents for at risk foot care today.  General Appearance  Alert, conversant and in no acute stress.  Vascular  Dorsalis pedis and posterior tibial  pulses are palpable  bilaterally.  Capillary return is within normal limits  bilaterally. Temperature is within normal limits  bilaterally.  Neurologic  Senn-Weinstein monofilament wire test absent  bilaterally. Muscle power within normal limits bilaterally.  Nails Thick disfigured discolored nails with subungual debris  from hallux to fifth toes bilaterally. No evidence of bacterial infection or drainage bilaterally.  Orthopedic  No limitations of motion  feet .  No crepitus or effusions noted.  No bony pathology or digital deformities noted.  DJD 1st MPJnnright  Skin  normotropic skin with no porokeratosis noted bilaterally.  No signs of infections or ulcers noted.     Onychomycosis  Pain in right toes  Pain in left toes  Consent was obtained for treatment procedures.   Mechanical debridement of nails 1-5  bilaterally performed with a nail nipper.  Filed with dremel without incident.    Return office visit   4 months                  Told patient to return for periodic foot care and evaluation due to potential at risk complications.   Maurilio Puryear DPM  

## 2019-09-29 DIAGNOSIS — N19 Unspecified kidney failure: Secondary | ICD-10-CM | POA: Diagnosis not present

## 2019-09-29 DIAGNOSIS — I10 Essential (primary) hypertension: Secondary | ICD-10-CM | POA: Diagnosis not present

## 2019-09-29 DIAGNOSIS — E782 Mixed hyperlipidemia: Secondary | ICD-10-CM | POA: Diagnosis not present

## 2019-09-29 DIAGNOSIS — E119 Type 2 diabetes mellitus without complications: Secondary | ICD-10-CM | POA: Diagnosis not present

## 2019-10-02 DIAGNOSIS — E119 Type 2 diabetes mellitus without complications: Secondary | ICD-10-CM | POA: Diagnosis not present

## 2019-10-02 DIAGNOSIS — Z6830 Body mass index (BMI) 30.0-30.9, adult: Secondary | ICD-10-CM | POA: Diagnosis not present

## 2019-10-02 DIAGNOSIS — N19 Unspecified kidney failure: Secondary | ICD-10-CM | POA: Diagnosis not present

## 2019-10-02 DIAGNOSIS — E782 Mixed hyperlipidemia: Secondary | ICD-10-CM | POA: Diagnosis not present

## 2019-11-06 DIAGNOSIS — E119 Type 2 diabetes mellitus without complications: Secondary | ICD-10-CM | POA: Diagnosis not present

## 2019-11-06 DIAGNOSIS — I10 Essential (primary) hypertension: Secondary | ICD-10-CM | POA: Diagnosis not present

## 2019-11-06 DIAGNOSIS — N19 Unspecified kidney failure: Secondary | ICD-10-CM | POA: Diagnosis not present

## 2019-11-06 DIAGNOSIS — E782 Mixed hyperlipidemia: Secondary | ICD-10-CM | POA: Diagnosis not present

## 2019-11-11 DIAGNOSIS — I10 Essential (primary) hypertension: Secondary | ICD-10-CM | POA: Diagnosis not present

## 2019-11-11 DIAGNOSIS — R748 Abnormal levels of other serum enzymes: Secondary | ICD-10-CM | POA: Diagnosis not present

## 2019-11-11 DIAGNOSIS — R6889 Other general symptoms and signs: Secondary | ICD-10-CM | POA: Diagnosis not present

## 2019-11-11 DIAGNOSIS — E1165 Type 2 diabetes mellitus with hyperglycemia: Secondary | ICD-10-CM | POA: Diagnosis not present

## 2019-11-11 DIAGNOSIS — E782 Mixed hyperlipidemia: Secondary | ICD-10-CM | POA: Diagnosis not present

## 2019-11-19 ENCOUNTER — Ambulatory Visit: Payer: Medicare HMO | Admitting: Neurology

## 2019-12-08 NOTE — Progress Notes (Signed)
[facility]    Provider:  Dr Lucia Gaskins Requesting Provider: [name], MD Primary Care Provider:  [name], MD  CC:  Hip pain  HPI:  Linda Lawson is a 84 y.o. female here as requested by [name], MD for elevated CK.  She has a past medical history of hypertension, diabetes, mixed hyperlipidemia and elevated CK(?). She has pain in her right hip she had it replaced in 2002 and it started to hurt since stopping the gym. Like something is punching her. Some weakness in the right leg. She has not had any xrays of the right hip. (She points to the right lower paraspinal muscle not really the hip). It does not shoot down her leg but her leg does get stiff anf her leg is in pain. The pain is in the lower leg. Lots of stiffness in the back. It is progressive, worse in the morning better with walking and moving around, now getting worse, right leg very stiff as opposed to weakness. No other focal neurologic deficits, associated symptoms, inciting events or modifiable factors.  Reviewed notes, labs and imaging from outside physicians, which showed:  CK 262, last May ck 231 Cr 1.1  Review of Systems: Patient complains of symptoms per HPI as well as the following symptoms: hip pain. Pertinent negatives and positives per HPI. All others negative.   Social History   Socioeconomic History  . Marital status: Single    Spouse name: Not on file  . Number of children: 4  . Years of education: Not on file  . Highest education level: Not on file  Occupational History  . Not on file  Tobacco Use  . Smoking status: Former Smoker    Years: 20.00    Quit date: 06/12/2008    Years since quitting: 11.5  . Smokeless tobacco: Never Used  Vaping Use  . Vaping Use: Never used  Substance and Sexual Activity  . Alcohol use: No  . Drug use: No  . Sexual activity: Not on file  Other Topics Concern  . Not on file  Social History Narrative   Lives alone   Does  not drive, uses [employer] and friends for transportation   Right handed   Caffeine: 1.5 cups/day   Social Determinants of Health   Financial Resource Strain:   . Difficulty of Paying Living Expenses:   Food Insecurity:   . Worried About Programme researcher, broadcasting/film/video in the Last Year:   . Barista in the Last Year:   Transportation Needs:   . Freight forwarder (Medical):   Marland Kitchen Lack of Transportation (Non-Medical):   Physical Activity:   . Days of Exercise per Week:   . Minutes of Exercise per Session:   Stress:   . Feeling of Stress :   Social Connections:   . Frequency of Communication with Friends and Family:   . Frequency of Social Gatherings with Friends and Family:   . Attends Religious Services:   . Active Member of Clubs or Organizations:   . Attends Banker Meetings:   Marland Kitchen Marital Status:   Intimate Partner Violence:   . Fear of Current or Ex-Partner:   . Emotionally Abused:   Marland Kitchen Physically Abused:   . Sexually Abused:     Family History  Problem Relation Age of Onset  . Diabetes Maternal Grandmother   . Heart disease Maternal Grandmother   . Diabetes Sister     Past Medical  History:  Diagnosis Date  . Arthritis   . Complication of anesthesia   . Depression    situatinal  . Diabetes mellitus without complication (HCC)    Type II  . Elevated CK   . Essential hypertension   . Gout   . Hip joint replacement status   . History of aortic valvular stenosis - s/p AVR 2010   s/p AVR (tissue) 2010; normal valve function by echo in 2016  . Hyperlipidemia   . Pneumonia 2010  . PONV (postoperative nausea and vomiting)   . Premature atrial contractions - asymptomatic 08/24/2016  . Primary osteoarthritis of left hip   . Status post aortic valve replacement with bioprosthetic valve 08/24/2008    Patient Active Problem List   Diagnosis Date Noted  . Chronic right-sided low back pain without sciatica 12/10/2019  . Hyperlipidemia LDL goal <100 08/06/2018  .  DOE (dyspnea on exertion) 08/06/2018  . Left hip pain 02/28/2017  . Abnormal electrocardiogram (ECG) (EKG) 08/26/2016  . Premature atrial contractions - asymptomatic 08/24/2016  . Essential hypertension   . Hip joint replacement status 04/19/2016  . Primary osteoarthritis of left hip   . Status post aortic valve replacement with bioprosthetic valve 08/24/2008    Past Surgical History:  Procedure Laterality Date  . ABDOMINAL HYSTERECTOMY    . AORTIC VALVE REPLACEMENT  2010   Bioprosthetic AVR [facility])  . CARDIOLITE NUCLEAR STRESS TEST  10/28/2017    [facility]): EF 70%.  No ischemia or infarction.  . COLONOSCOPY    . HIP ARTHROPLASTY Right 2002  . JOINT REPLACEMENT    . KNEE ARTHROPLASTY Left 2012  . LEFT HEART CATH AND CORONARY ANGIOGRAPHY  2010   Nonobstructive CAD as part of preop AVR surgery  . TOTAL HIP ARTHROPLASTY Left 04/19/2016   Procedure: LEFT TOTAL HIP ARTHROPLASTY ANTERIOR APPROACH;  Surgeon: Tarry Kos, MD;  Location: [facility] OR;  Service: Orthopedics;  Laterality: Left;  . TRANSTHORACIC ECHOCARDIOGRAM  12/04/2017   [facility]): Normal LV size and function with no segmental wall motion abnormality.  Mild concentric LVH.  EF 60 to 65%.  Normally functioning bioprosthetic aortic valve with only mild gradient (mean gradient 15 mmHg).  Mild MR.     Current Outpatient Medications  Medication Sig Dispense Refill  . ACCU-CHEK AVIVA PLUS test strip     . ACCU-CHEK SOFTCLIX LANCETS lancets     . Alcohol Swabs (B-D SINGLE USE SWABS REGULAR) PADS     . allopurinol (ZYLOPRIM) 100 MG tablet Take 100 mg by mouth daily.     Marland Kitchen aspirin EC 81 MG tablet Take 81 mg by mouth daily. Swallow whole.     . B-D UF III MINI PEN NEEDLES 31G X 5 MM MISC     . diclofenac sodium (VOLTAREN) 1 % GEL As needed for leg pain    . docusate sodium (COLACE) 100 MG capsule Take 200 mg by mouth 2 (two) times daily as needed.    . Evolocumab (REPATHA) 140 MG/ML SOSY Inject 1 mL  into the skin every 14 (fourteen) days.    Marland Kitchen LANTUS SOLOSTAR 100 UNIT/ML Solostar Pen Inject 10 Units into the skin daily.     Marland Kitchen loratadine (CLARITIN) 10 MG tablet Take 10 mg by mouth daily as needed for allergies or rhinitis.    Marland Kitchen losartan (COZAAR) 50 MG tablet Take 50 mg by mouth daily.     . Multiple Vitamin (MULTIVITAMIN PO) Take by mouth daily.    Marland Kitchen  OZEMPIC, 0.25 OR 0.5 MG/DOSE, 2 MG/1.5ML SOPN Inject 0.25 mg into the skin once a week.      No current facility-administered medications for this visit.    Allergies as of 12/09/2019 - Review Complete 12/09/2019  Allergen Reaction Noted  . Penicillins Swelling and Rash 06/12/2012  . Codeine Nausea And Vomiting 04/16/2016    Vitals: BP (!) 157/82 (BP Location: Right Arm, Patient Position: Sitting)   Pulse 68   Ht 5\' 4"  (1.626 m)   Wt 177 lb (80.3 kg)   BMI 30.38 kg/m  Last Weight:  Wt Readings from Last 1 Encounters:  12/09/19 177 lb (80.3 kg)   Last Height:   Ht Readings from Last 1 Encounters:  12/09/19 5\' 4"  (1.626 m)     Physical exam: Exam: Gen: NAD, conversant, well nourised, obese, well groomed                     CV: RRR, +SEM. No Carotid Bruits. No peripheral edema, warm, nontender Eyes: Conjunctivae clear without exudates or hemorrhage  Neuro: Detailed Neurologic Exam  Speech:    Speech is normal; fluent and spontaneous with normal comprehension.  Cognition:    The patient is oriented to person, place, and time;     recent and remote memory intact;     language fluent;     normal attention, concentration,     fund of knowledge Cranial Nerves:    The pupils are equal, round, and reactive to light. Attempted fundoscopy could not visualize. Visual fields are full to finger confrontation. Extraocular movements are intact. Trigeminal sensation is intact and the muscles of mastication are normal. The face is symmetric. The palate elevates in the midline. Hearing intact. Voice is normal. Shoulder shrug is normal.  The tongue has normal motion without fasciculations.   Coordination: No ataxia or dysmetria  Gait:    Heel-toe intact, can get out out seat without theuse of her hands,  and tandem gait with mild imbalance.   Motor Observation:    No asymmetry, no atrophy, and no involuntary movements noted. Tone:    Normal muscle tone.    Posture:    Posture is normal. normal erect    Strength: right hip flexion weakness. Otherwise.    Strength is V/V in the upper and lower limbs.      Sensation: intact to LT     Reflex Exam:  DTR's:    Absent AJs. Deep tendon reflexes in the upper and lower extremities are symmetrical bilaterally.   Toes:    The toes are downgoing bilaterally.   Clonus:    Clonus is absent.    Assessment/Plan: This is a really lovely 84 year old female who comes in for right hip pain however she points to the right lower paraspinal area.  On examination external rotation of that right leg does not replicate the pain so I do not think this has anything to do with her hip.  This may be arthritic changes in the back causing paraspinal muscle spasms or possibly lumbar radiculopathy given right leg proximal weakness.  I spoke with patient for a long time about ways to evaluate her and treat her including MRI of the lumbar spine, EMG nerve conduction study and physical therapy.  Patient decided to try physical therapy first.  I encourage patient that if she does not improve with physical therapy she is to call me and we will proceed with MRI lumbar spine as well as EMG nerve conduction study.  Orders Placed This Encounter  Procedures  . Ambulatory referral to Physical Therapy   No orders of the defined types were placed in this encounter.   Cc: Lewis Moccasinewey, Elizabeth R, MD,    I spent 65 minutes of face-to-face and non-face-to-face time with patient on the  1. Chronic right-sided low back pain without sciatica   2. Right lumbar radiculopathy   3. Right leg weakness     diagnosis.  This included previsit chart review, lab review, study review, order entry, electronic health record documentation, patient education on the different diagnostic and therapeutic options, counseling and coordination of care, risks and benefits of management, compliance, or risk factor reduction   Naomie DeanAntonia Promiss Labarbera, MD  [facility] [address] [place], [place] [id number]  Phone [phone] Fax [phone]

## 2019-12-09 ENCOUNTER — Ambulatory Visit: Payer: Medicare HMO | Admitting: Neurology

## 2019-12-09 ENCOUNTER — Encounter: Payer: Self-pay | Admitting: Neurology

## 2019-12-09 VITALS — BP 157/82 | HR 68 | Ht 64.0 in | Wt 177.0 lb

## 2019-12-09 DIAGNOSIS — R29898 Other symptoms and signs involving the musculoskeletal system: Secondary | ICD-10-CM | POA: Diagnosis not present

## 2019-12-09 DIAGNOSIS — G8929 Other chronic pain: Secondary | ICD-10-CM

## 2019-12-09 DIAGNOSIS — M545 Low back pain: Secondary | ICD-10-CM

## 2019-12-09 DIAGNOSIS — R6889 Other general symptoms and signs: Secondary | ICD-10-CM | POA: Diagnosis not present

## 2019-12-09 DIAGNOSIS — M5416 Radiculopathy, lumbar region: Secondary | ICD-10-CM

## 2019-12-09 NOTE — Patient Instructions (Signed)
Physical Therapy   Radicular Pain Radicular pain is a type of pain that spreads from your back or neck along a spinal nerve. Spinal nerves are nerves that leave the spinal cord and go to the muscles. Radicular pain is sometimes called radiculopathy, radiculitis, or a pinched nerve. When you have this type of pain, you may also have weakness, numbness, or tingling in the area of your body that is supplied by the nerve. The pain may feel sharp and burning. Depending on which spinal nerve is affected, the pain may occur in the:  Neck area (cervical radicular pain). You may also feel pain, numbness, weakness, or tingling in the arms.  Mid-spine area (thoracic radicular pain). You would feel this pain in the back and chest. This type is rare.  Lower back area (lumbar radicular pain). You would feel this pain as low back pain. You may feel pain, numbness, weakness, or tingling in the buttocks or legs. Sciatica is a type of lumbar radicular pain that shoots down the back of the leg. Radicular pain occurs when one of the spinal nerves becomes irritated or squeezed (compressed). It is often caused by something pushing on a spinal nerve, such as one of the bones of the spine (vertebrae) or one of the round cushions between vertebrae (intervertebral disks). This can result from:  An injury.  Wear and tear or aging of a disk.  The growth of a bone spur that pushes on the nerve. Radicular pain often goes away when you follow instructions from your health care provider for relieving pain at home. Follow these instructions at home: Managing pain      If directed, put ice on the affected area: ? Put ice in a plastic bag. ? Place a towel between your skin and the bag. ? Leave the ice on for 20 minutes, 2-3 times a day.  If directed, apply heat to the affected area as often as told by your health care provider. Use the heat source that your health care provider recommends, such as a moist heat pack or a  heating pad. ? Place a towel between your skin and the heat source. ? Leave the heat on for 20-30 minutes. ? Remove the heat if your skin turns bright red. This is especially important if you are unable to feel pain, heat, or cold. You may have a greater risk of getting burned. Activity   Do not sit or rest in bed for long periods of time.  Try to stay as active as possible. Ask your health care provider what type of exercise or activity is best for you.  Avoid activities that make your pain worse, such as bending and lifting.  Do not lift anything that is heavier than 10 lb (4.5 kg), or the limit that you are told, until your health care provider says that it is safe.  Practice using proper technique when lifting items. Proper lifting technique involves bending your knees and rising up.  Do strength and range-of-motion exercises only as told by your health care provider or physical therapist. General instructions  Take over-the-counter and prescription medicines only as told by your health care provider.  Pay attention to any changes in your symptoms.  Keep all follow-up visits as told by your health care provider. This is important. ? Your health care provider may send you to a physical therapist to help with this pain. Contact a health care provider if:  Your pain and other symptoms get worse.  Your  pain medicine is not helping.  Your pain has not improved after a few weeks of home care.  You have a fever. Get help right away if:  You have severe pain, weakness, or numbness.  You have difficulty with bladder or bowel control. Summary  Radicular pain is a type of pain that spreads from your back or neck along a spinal nerve.  When you have radicular pain, you may also have weakness, numbness, or tingling in the area of your body that is supplied by the nerve.  The pain may feel sharp or burning.  Radicular pain may be treated with ice, heat, medicines, or physical  therapy. This information is not intended to replace advice given to you by your health care provider. Make sure you discuss any questions you have with your health care provider. Document Revised: 11/12/2017 Document Reviewed: 11/12/2017 Elsevier Patient Education  2020 [employer].

## 2019-12-10 ENCOUNTER — Encounter: Payer: Self-pay | Admitting: Neurology

## 2019-12-10 DIAGNOSIS — G8929 Other chronic pain: Secondary | ICD-10-CM | POA: Insufficient documentation

## 2019-12-24 ENCOUNTER — Other Ambulatory Visit: Payer: Self-pay

## 2019-12-24 ENCOUNTER — Encounter: Payer: Self-pay | Admitting: Physical Therapy

## 2019-12-24 ENCOUNTER — Ambulatory Visit: Payer: Medicare HMO | Attending: Neurology | Admitting: Physical Therapy

## 2019-12-24 DIAGNOSIS — R296 Repeated falls: Secondary | ICD-10-CM | POA: Diagnosis not present

## 2019-12-24 DIAGNOSIS — M5441 Lumbago with sciatica, right side: Secondary | ICD-10-CM | POA: Insufficient documentation

## 2019-12-24 DIAGNOSIS — M6281 Muscle weakness (generalized): Secondary | ICD-10-CM | POA: Insufficient documentation

## 2019-12-24 NOTE — Therapy (Signed)
[facility] [place] [address] [place], [place], [address] Phone: [phone]   Fax:  [phone]  Physical Therapy Evaluation  Patient Details  Name: Linda Lawson MRN: [id number] Date of Birth: March 11, 1936 Referring Provider (PT): Lucia Gaskins   Encounter Date: 12/24/2019   PT End of Session - 12/24/19 1145    Visit Number 1    Number of Visits 12    Date for PT Re-Evaluation 02/23/20    Authorization Type [employer]    PT Start Time 1017    PT Stop Time 1058    PT Time Calculation (min) 41 min    Activity Tolerance Patient tolerated treatment well    Behavior During Therapy [facility] for tasks assessed/performed           Past Medical History:  Diagnosis Date  . Arthritis   . Complication of anesthesia   . Depression    situatinal  . Diabetes mellitus without complication (HCC)    Type II  . Elevated CK   . Essential hypertension   . Gout   . Hip joint replacement status   . History of aortic valvular stenosis - s/p AVR 2010   s/p AVR (tissue) 2010; normal valve function by echo in 2016  . Hyperlipidemia   . Pneumonia 2010  . PONV (postoperative nausea and vomiting)   . Premature atrial contractions - asymptomatic 08/24/2016  . Primary osteoarthritis of left hip   . Status post aortic valve replacement with bioprosthetic valve 08/24/2008    Past Surgical History:  Procedure Laterality Date  . ABDOMINAL HYSTERECTOMY    . AORTIC VALVE REPLACEMENT  2010   Bioprosthetic AVR [facility])  . CARDIOLITE NUCLEAR STRESS TEST  10/28/2017    [facility]): EF 70%.  No ischemia or infarction.  . COLONOSCOPY    . HIP ARTHROPLASTY Right 2002  . JOINT REPLACEMENT    . KNEE ARTHROPLASTY Left 2012  . LEFT HEART CATH AND CORONARY ANGIOGRAPHY  2010   Nonobstructive CAD as part of preop AVR surgery  . TOTAL HIP ARTHROPLASTY Left 04/19/2016   Procedure: LEFT TOTAL HIP ARTHROPLASTY ANTERIOR APPROACH;  Surgeon: Tarry Kos, MD;  Location: [facility] OR;  Service: Orthopedics;  Laterality: Left;  . TRANSTHORACIC ECHOCARDIOGRAM  12/04/2017   [facility]): Normal LV size and function with no segmental wall motion abnormality.  Mild concentric LVH.  EF 60 to 65%.  Normally functioning bioprosthetic aortic valve with only mild gradient (mean gradient 15 mmHg).  Mild MR.     There were no vitals filed for this visit.    Subjective Assessment - 12/24/19 1019    Subjective Pt reports that she has been having LBP for the past 2-3 months with some radiating pain into RLE on lateral/posterior side. Pt states that she had a fall down the stairs on Friday 12/18/19; reports some soreness in L shoulder and RLB, denies hitting head, new headache, new double vision, or increased pain elsewhere. Pt states that LBP is the worst when she wakes up in the morning and gets better throughout the day.    Limitations Lifting;Walking;House hold activities   stairs   Diagnostic tests none    Patient Stated Goals reduce pain; be able to move around without pain    Currently in Pain? Yes    Pain Score 5     Pain Location Back    Pain Orientation Right;Lower    Pain Descriptors / Indicators Aching;Lambert Mody  Pain Type Acute pain    Pain Radiating Towards occasionally towards R lateral/posterior leg; denies N/T    Pain Onset More than a month ago    Pain Frequency Intermittent    Aggravating Factors  bending, lifting, worse in morning    Pain Relieving Factors walking, moving around              [facility] PT Assessment - 12/24/19 0001      Assessment   Medical Diagnosis LBP    Referring Provider (PT) Lucia Gaskins    Prior Therapy PT for L THA ~3 yrs ago      Precautions   Precautions None      Restrictions   Weight Bearing Restrictions No      Balance Screen   Has the patient fallen in the past 6 months Yes    How many times? 3   2 down the stairs, 1 while bringing in groceries   Has the patient had a decrease in activity level  because of a fear of falling?  Yes   limits number of times going up/down stairs   Is the patient reluctant to leave their home because of a fear of falling?  Yes   scared to go walking like she used to     [employer]   Additional Comments flight of stairs to get to bedroom and laundry; has fallen down stairs 4-5 times this year      Prior Function   Level of Independence Independent    Vocation Retired    Gaffer occasionally uses SPC for mobility    Leisure walking, going to gym      Sensation   Light Touch Impaired by gross assessment    Additional Comments mild impairment B sensation on plantar aspect of feet      Functional Tests   Functional tests Sit to Stand      Sit to Stand   Comments 5x STS WFL; functional weakness evident with increasing reps      ROM / Strength   AROM / PROM / Strength AROM;Strength      AROM   Overall AROM Comments lumbar AROM limited 50%      Strength   Overall Strength Comments LLE 3+/5, RLE 4-/5      Palpation   Palpation comment tender to palpation R lumbar paraspinals and R glute/piriformis      Ambulation/Gait   Gait Comments slight trendelenburg with gait and increased lateral lean      Standardized Balance Assessment   Standardized Balance Assessment Timed Up and Go Test      Timed Up and Go Test   Normal TUG (seconds) 12    TUG Comments near LOB when turning                      Objective measurements completed on examination: See above findings.       [facility] Adult PT Treatment/Exercise - 12/24/19 0001      Exercises   Exercises Lumbar      Lumbar Exercises: Stretches   Lower Trunk Rotation 5 reps;10 seconds      Lumbar Exercises: Seated   Sit to Stand 10 reps    Sit to Stand Limitations with cues for eccentric control      Lumbar Exercises: Supine   Pelvic Tilt 10 reps    Bridge 10 reps;3 seconds  PT Education - 12/24/19 1145    Education Details Pt  educated on POC and HEP    Person(s) Educated Patient    Methods Explanation;Demonstration;Handout    Comprehension Verbalized understanding;Returned demonstration            PT Short Term Goals - 12/24/19 1151      PT SHORT TERM GOAL #1   Title Pt will be independent with HEP    Time 2    Period Weeks    Status New    Target Date 01/07/20             PT Long Term Goals - 12/24/19 1151      PT LONG TERM GOAL #1   Title Pt will demo ability to ascend/descend 1 flight of stairs with HR and superivison assist in order to safely access home.    Time 8    Period Weeks    Status New    Target Date 02/18/20      PT LONG TERM GOAL #2   Title Pt will demonstrate BLE MMT 4+/5    Time 8    Period Weeks    Status New    Target Date 02/18/20      PT LONG TERM GOAL #3   Title Pt will demo lumbar ROM improved 25%    Baseline 50% limited    Time 8    Period Weeks    Status New    Target Date 02/18/20      PT LONG TERM GOAL #4   Title Pt will report 50% reduction in LBP    Time 8    Period Weeks    Status New    Target Date 02/18/20                  Plan - 12/24/19 1146    Clinical Impression Statement Pt presents to clinic with reports of R LBP with occasional radiating pain into lateral/posterior RLE worsening over the last ~2 months. Pt reports a fall down the stairs on Friday; denies hitting head, HA, new double vision, or new pain. Pt reports has fallen down stairs several times over the past year and experiences LOB when turning. Pt demos limited lumbar ROM, LE flexibility deficits, LE strength deficits R>L, functional weakness, and balance deficits. Pt mild sensation impairment on plantar surface of B feet. Pt would benefit from skilled PT to address the above impairments.    Personal Factors and Comorbidities Age;Comorbidity 3+    Comorbidities arthritis, diabetes, HTN    Examination-Activity Limitations Bend;Carry;Lift;Stand;Stairs     Examination-Participation Restrictions Cleaning;Community Activity;Interpersonal Relationship;Laundry    Stability/Clinical Decision Making Evolving/Moderate complexity    Clinical Decision Making Moderate    Rehab Potential Good    PT Frequency 2x / week    PT Duration 8 weeks    PT Treatment/Interventions ADLs/Self Care Home Management;Electrical Stimulation;Moist Heat;Iontophoresis 4mg /ml Dexamethasone;Gait training;Stair training;Functional mobility training;Therapeutic activities;Therapeutic exercise;Balance training;Neuromuscular re-education;Manual techniques;Patient/family education;Passive range of motion;Dry needling    PT Next Visit Plan balance testing, lumbar flexibility, LE strength/stretching, incorporate balance and stair training to address frequent falling    PT Home Exercise Plan STS, bridge, PPT, LTR    Consulted and Agree with Plan of Care Patient           Patient will benefit from skilled therapeutic intervention in order to improve the following deficits and impairments:  Abnormal gait, Decreased range of motion, Difficulty walking, Decreased safety awareness, Pain, Decreased balance, Impaired flexibility, Decreased  strength, Impaired sensation  Visit Diagnosis: Acute right-sided low back pain with right-sided sciatica  Muscle weakness (generalized)  Frequent falls     Problem List Patient Active Problem List   Diagnosis Date Noted  . Chronic right-sided low back pain without sciatica 12/10/2019  . Hyperlipidemia LDL goal <100 08/06/2018    Class: Diagnosis of  . DOE (dyspnea on exertion) 08/06/2018  . Left hip pain 02/28/2017  . Abnormal electrocardiogram (ECG) (EKG) 08/26/2016  . Premature atrial contractions - asymptomatic 08/24/2016  . Essential hypertension   . Hip joint replacement status 04/19/2016  . Primary osteoarthritis of left hip   . Status post aortic valve replacement with bioprosthetic valve 08/24/2008   Lysle RubensAmanda Markan Cazarez, PT, DPT Maryanna ShapeAmanda M  Dontray Haberland 12/24/2019, 11:55 AM  [facility] [place] [facility] [address] [place], [place], [address] Phone: [phone]   Fax:  [phone]  Name: Neldon Mcvelyn D Melvin MRN: [id number] Date of Birth: 12-Jan-1936

## 2019-12-29 ENCOUNTER — Encounter: Payer: Self-pay | Admitting: Physical Therapy

## 2019-12-29 ENCOUNTER — Ambulatory Visit: Payer: Medicare HMO | Admitting: Physical Therapy

## 2019-12-29 ENCOUNTER — Other Ambulatory Visit: Payer: Self-pay

## 2019-12-29 DIAGNOSIS — R296 Repeated falls: Secondary | ICD-10-CM

## 2019-12-29 DIAGNOSIS — M6281 Muscle weakness (generalized): Secondary | ICD-10-CM | POA: Diagnosis not present

## 2019-12-29 DIAGNOSIS — M5441 Lumbago with sciatica, right side: Secondary | ICD-10-CM

## 2019-12-29 NOTE — Therapy (Signed)
[facility] [place] [facility] [address] [place], [place], [address] Phone: [phone]   Fax:  [phone]  Physical Therapy Treatment  Patient Details  Name: Linda Lawson MRN: [id number] Date of Birth: 23-Jun-1935 Referring Provider (PT): Lucia Gaskins   Encounter Date: 12/29/2019   PT End of Session - 12/29/19 1138    Visit Number 2    Date for PT Re-Evaluation 02/23/20    Authorization Type [employer]    PT Start Time 1059    PT Stop Time 1140    PT Time Calculation (min) 41 min           Past Medical History:  Diagnosis Date  . Arthritis   . Complication of anesthesia   . Depression    situatinal  . Diabetes mellitus without complication (HCC)    Type II  . Elevated CK   . Essential hypertension   . Gout   . Hip joint replacement status   . History of aortic valvular stenosis - s/p AVR 2010   s/p AVR (tissue) 2010; normal valve function by echo in 2016  . Hyperlipidemia   . Pneumonia 2010  . PONV (postoperative nausea and vomiting)   . Premature atrial contractions - asymptomatic 08/24/2016  . Primary osteoarthritis of left hip   . Status post aortic valve replacement with bioprosthetic valve 08/24/2008    Past Surgical History:  Procedure Laterality Date  . ABDOMINAL HYSTERECTOMY    . AORTIC VALVE REPLACEMENT  2010   Bioprosthetic AVR [facility])  . CARDIOLITE NUCLEAR STRESS TEST  10/28/2017    [facility]): EF 70%.  No ischemia or infarction.  . COLONOSCOPY    . HIP ARTHROPLASTY Right 2002  . JOINT REPLACEMENT    . KNEE ARTHROPLASTY Left 2012  . LEFT HEART CATH AND CORONARY ANGIOGRAPHY  2010   Nonobstructive CAD as part of preop AVR surgery  . TOTAL HIP ARTHROPLASTY Left 04/19/2016   Procedure: LEFT TOTAL HIP ARTHROPLASTY ANTERIOR APPROACH;  Surgeon: Tarry Kos, MD;  Location: [facility] OR;  Service: Orthopedics;  Laterality: Left;  . TRANSTHORACIC ECHOCARDIOGRAM  12/04/2017   [facility]):  Normal LV size and function with no segmental wall motion abnormality.  Mild concentric LVH.  EF 60 to 65%.  Normally functioning bioprosthetic aortic valve with only mild gradient (mean gradient 15 mmHg).  Mild MR.     There were no vitals filed for this visit.   Subjective Assessment - 12/29/19 1058    Subjective Low back pain on the R side from fall.    Currently in Pain? Yes    Pain Score 5     Pain Location Back   L shoulder   Pain Orientation Right;Lower                             [facility] Adult PT Treatment/Exercise - 12/29/19 0001      High Level Balance   High Level Balance Activities Side stepping;Backward walking      Lumbar Exercises: Aerobic   Nustep L3 x 6 min       Lumbar Exercises: Seated   Long Arc Quad on Chair Both;20 reps;Strengthening;2 sets    LAQ on Chair Weights (lbs) 2    Sit to Stand 10 reps   x2 airex on mat table    Other Seated Lumbar Exercises ball squeezes 2x10    Other Seated Lumbar Exercises HS curls red  2x10, Hip abd 2x10 with manual resistance                     PT Short Term Goals - 12/29/19 1142      PT SHORT TERM GOAL #1   Title Pt will be independent with HEP    Status Achieved             PT Long Term Goals - 12/24/19 1151      PT LONG TERM GOAL #1   Title Pt will demo ability to ascend/descend 1 flight of stairs with HR and superivison assist in order to safely access home.    Time 8    Period Weeks    Status New    Target Date 02/18/20      PT LONG TERM GOAL #2   Title Pt will demonstrate BLE MMT 4+/5    Time 8    Period Weeks    Status New    Target Date 02/18/20      PT LONG TERM GOAL #3   Title Pt will demo lumbar ROM improved 25%    Baseline 50% limited    Time 8    Period Weeks    Status New    Target Date 02/18/20      PT LONG TERM GOAL #4   Title Pt will report 50% reduction in LBP    Time 8    Period Weeks    Status New    Target Date 02/18/20                  Plan - 12/29/19 1139    Clinical Impression Statement Pt tolerated an initial progression to TE interventions well. Cue needed for sequencing with sit to stand. Cues given to complete LE curls with full available ROM. No report of increase pain. Tactile cues to keep hip square needed with side steps. Some LOB with backwards walking requiring min assist to recover.    Personal Factors and Comorbidities Age;Comorbidity 3+    Comorbidities arthritis, diabetes, HTN    Examination-Activity Limitations Bend;Carry;Lift;Stand;Stairs    Examination-Participation Restrictions Cleaning;Community Activity;Interpersonal Relationship;Laundry    Stability/Clinical Decision Making Evolving/Moderate complexity    Rehab Potential Good    PT Frequency 2x / week    PT Duration 8 weeks    PT Treatment/Interventions ADLs/Self Care Home Management;Electrical Stimulation;Moist Heat;Iontophoresis 4mg /ml Dexamethasone;Gait training;Stair training;Functional mobility training;Therapeutic activities;Therapeutic exercise;Balance training;Neuromuscular re-education;Manual techniques;Patient/family education;Passive range of motion;Dry needling    PT Next Visit Plan balance testing, lumbar flexibility, LE strength/stretching, incorporate balance and stair training to address frequent falling           Patient will benefit from skilled therapeutic intervention in order to improve the following deficits and impairments:  Abnormal gait, Decreased range of motion, Difficulty walking, Decreased safety awareness, Pain, Decreased balance, Impaired flexibility, Decreased strength, Impaired sensation  Visit Diagnosis: Frequent falls  Acute right-sided low back pain with right-sided sciatica  Muscle weakness (generalized)     Problem List Patient Active Problem List   Diagnosis Date Noted  . Chronic right-sided low back pain without sciatica 12/10/2019  . Hyperlipidemia LDL goal <100 08/06/2018    Class: Diagnosis of  .  DOE (dyspnea on exertion) 08/06/2018  . Left hip pain 02/28/2017  . Abnormal electrocardiogram (ECG) (EKG) 08/26/2016  . Premature atrial contractions - asymptomatic 08/24/2016  . Essential hypertension   . Hip joint replacement status 04/19/2016  . Primary osteoarthritis of left hip   .  Status post aortic valve replacement with bioprosthetic valve 08/24/2008    Grayce Sessions, PTA 12/29/2019, 11:42 AM  [facility] [address] [place], [place], [address] Phone: [phone]   Fax:  [phone]  Name: Linda Lawson MRN: [id number] Date of Birth: 1935-11-06

## 2020-01-01 ENCOUNTER — Ambulatory Visit: Payer: Medicare HMO | Admitting: Podiatry

## 2020-01-01 ENCOUNTER — Encounter: Payer: Self-pay | Admitting: Podiatry

## 2020-01-01 ENCOUNTER — Other Ambulatory Visit: Payer: Self-pay

## 2020-01-01 DIAGNOSIS — L608 Other nail disorders: Secondary | ICD-10-CM

## 2020-01-01 DIAGNOSIS — M79676 Pain in unspecified toe(s): Secondary | ICD-10-CM

## 2020-01-01 DIAGNOSIS — E119 Type 2 diabetes mellitus without complications: Secondary | ICD-10-CM

## 2020-01-01 DIAGNOSIS — B351 Tinea unguium: Secondary | ICD-10-CM

## 2020-01-01 NOTE — Progress Notes (Signed)
This patient returns to my office for at risk foot care.  This patient requires this care by a professional since this patient will be at risk due to having diabetes.   This patient is unable to cut nails herself since the patient cannot reach her nails.These nails are painful walking and wearing shoes.  This patient presents for at risk foot care today.  General Appearance  Alert, conversant and in no acute stress.  Vascular  Dorsalis pedis and posterior tibial  pulses are palpable  bilaterally.  Capillary return is within normal limits  bilaterally. Temperature is within normal limits  bilaterally.  Neurologic  Senn-Weinstein monofilament wire test absent  bilaterally. Muscle power within normal limits bilaterally.  Nails Thick disfigured discolored nails with subungual debris  from hallux to fifth toes bilaterally. No evidence of bacterial infection or drainage bilaterally.  Orthopedic  No limitations of motion  feet .  No crepitus or effusions noted.  No bony pathology or digital deformities noted.  DJD 1st MPJnnright  Skin  normotropic skin with no porokeratosis noted bilaterally.  No signs of infections or ulcers noted.     Onychomycosis  Pain in right toes  Pain in left toes  Consent was obtained for treatment procedures.   Mechanical debridement of nails 1-5  bilaterally performed with a nail nipper.  Filed with dremel without incident.    Return office visit   4 months                  Told patient to return for periodic foot care and evaluation due to potential at risk complications.   Helane Gunther DPM

## 2020-01-04 ENCOUNTER — Encounter: Payer: Self-pay | Admitting: Physical Therapy

## 2020-01-04 ENCOUNTER — Ambulatory Visit: Payer: Medicare HMO | Admitting: Physical Therapy

## 2020-01-04 ENCOUNTER — Other Ambulatory Visit: Payer: Self-pay

## 2020-01-04 DIAGNOSIS — M5441 Lumbago with sciatica, right side: Secondary | ICD-10-CM | POA: Diagnosis not present

## 2020-01-04 DIAGNOSIS — M6281 Muscle weakness (generalized): Secondary | ICD-10-CM

## 2020-01-04 DIAGNOSIS — R296 Repeated falls: Secondary | ICD-10-CM

## 2020-01-04 NOTE — Therapy (Signed)
[facility] [address] [place], [place], [address] Phone: [phone]   Fax:  [phone]  Physical Therapy Treatment  Patient Details  Name: Linda Lawson MRN: [id number] Date of Birth: 1935/11/30 Referring Provider (PT): Lucia Gaskins   Encounter Date: 01/04/2020   PT End of Session - 01/04/20 1439    Visit Number 3    Number of Visits 12    Date for PT Re-Evaluation 02/23/20    Authorization Type [employer]    PT Start Time 1400    PT Stop Time 1440    PT Time Calculation (min) 40 min    Activity Tolerance Patient tolerated treatment well    Behavior During Therapy [facility] for tasks assessed/performed           Past Medical History:  Diagnosis Date   Arthritis    Complication of anesthesia    Depression    situatinal   Diabetes mellitus without complication (HCC)    Type II   Elevated CK    Essential hypertension    Gout    Hip joint replacement status    History of aortic valvular stenosis - s/p AVR 2010   s/p AVR (tissue) 2010; normal valve function by echo in 2016   Hyperlipidemia    Pneumonia 2010   PONV (postoperative nausea and vomiting)    Premature atrial contractions - asymptomatic 08/24/2016   Primary osteoarthritis of left hip    Status post aortic valve replacement with bioprosthetic valve 08/24/2008    Past Surgical History:  Procedure Laterality Date   ABDOMINAL HYSTERECTOMY     AORTIC VALVE REPLACEMENT  2010   Bioprosthetic AVR ([facility])   CARDIOLITE NUCLEAR STRESS TEST  10/28/2017    [facility]): EF 70%.  No ischemia or infarction.   COLONOSCOPY     HIP ARTHROPLASTY Right 2002   JOINT REPLACEMENT     KNEE ARTHROPLASTY Left 2012   LEFT HEART CATH AND CORONARY ANGIOGRAPHY  2010   Nonobstructive CAD as part of preop AVR surgery   TOTAL HIP ARTHROPLASTY Left 04/19/2016   Procedure: LEFT TOTAL HIP ARTHROPLASTY ANTERIOR APPROACH;  Surgeon: Tarry Kos, MD;  Location: [facility] OR;  Service: Orthopedics;  Laterality: Left;   TRANSTHORACIC ECHOCARDIOGRAM  12/04/2017   [facility]): Normal LV size and function with no segmental wall motion abnormality.  Mild concentric LVH.  EF 60 to 65%.  Normally functioning bioprosthetic aortic valve with only mild gradient (mean gradient 15 mmHg).  Mild MR.     There were no vitals filed for this visit.   Subjective Assessment - 01/04/20 1403    Subjective Pt reports she is doing well; no pain today but soreness on R side from fall. Reports no new falls.    Currently in Pain? No/denies    Pain Score 0-No pain                             [facility] Adult PT Treatment/Exercise - 01/04/20 0001      High Level Balance   High Level Balance Activities Side stepping;Backward walking      Lumbar Exercises: Aerobic   Nustep L5 x 7 min      Lumbar Exercises: Standing   Heel Raises 10 reps    Heel Raises Limitations 2#    Other Standing Lumbar Exercises standing hip abd/ext/flexion  and knee flexion with 2# 2x10  Other Standing Lumbar Exercises STS 3x5 with yellow ball chest press      Lumbar Exercises: Seated   Long Arc Quad on Chair Both;20 reps;Strengthening;2 sets    LAQ on Chair Weights (lbs) 2    Other Seated Lumbar Exercises ball squeezes 2x10    Other Seated Lumbar Exercises HS curls green TB 2x10                    PT Short Term Goals - 12/29/19 1142      PT SHORT TERM GOAL #1   Title Pt will be independent with HEP    Status Achieved             PT Long Term Goals - 12/24/19 1151      PT LONG TERM GOAL #1   Title Pt will demo ability to ascend/descend 1 flight of stairs with HR and superivison assist in order to safely access home.    Time 8    Period Weeks    Status New    Target Date 02/18/20      PT LONG TERM GOAL #2   Title Pt will demonstrate BLE MMT 4+/5    Time 8    Period Weeks    Status New    Target Date 02/18/20      PT LONG TERM  GOAL #3   Title Pt will demo lumbar ROM improved 25%    Baseline 50% limited    Time 8    Period Weeks    Status New    Target Date 02/18/20      PT LONG TERM GOAL #4   Title Pt will report 50% reduction in LBP    Time 8    Period Weeks    Status New    Target Date 02/18/20                 Plan - 01/04/20 1441    Clinical Impression Statement Pt did well with progression of PT; able to tolerate standing ex's without any increase in pain. Needed cues for sequencing of exercise. CGA-min A with backwards walking.    PT Treatment/Interventions ADLs/Self Care Home Management;Electrical Stimulation;Moist Heat;Iontophoresis 4mg /ml Dexamethasone;Gait training;Stair training;Functional mobility training;Therapeutic activities;Therapeutic exercise;Balance training;Neuromuscular re-education;Manual techniques;Patient/family education;Passive range of motion;Dry needling    PT Next Visit Plan balance testing, lumbar flexibility, LE strength/stretching, incorporate balance and stair training to address frequent falling    Consulted and Agree with Plan of Care Patient           Patient will benefit from skilled therapeutic intervention in order to improve the following deficits and impairments:  Abnormal gait, Decreased range of motion, Difficulty walking, Decreased safety awareness, Pain, Decreased balance, Impaired flexibility, Decreased strength, Impaired sensation  Visit Diagnosis: Frequent falls  Acute right-sided low back pain with right-sided sciatica  Muscle weakness (generalized)     Problem List Patient Active Problem List   Diagnosis Date Noted   Chronic right-sided low back pain without sciatica 12/10/2019   Hyperlipidemia LDL goal <100 08/06/2018    Class: Diagnosis of   DOE (dyspnea on exertion) 08/06/2018   Left hip pain 02/28/2017   Abnormal electrocardiogram (ECG) (EKG) 08/26/2016   Premature atrial contractions - asymptomatic 08/24/2016   Essential  hypertension    Hip joint replacement status 04/19/2016   Primary osteoarthritis of left hip    Status post aortic valve replacement with bioprosthetic valve 08/24/2008   08/26/2008, PT, DPT Lysle Rubens Genean Adamski 01/04/2020,  2:43 PM  [facility] [address] [place], [place], [address] Phone: [phone]   Fax:  [phone]  Name: ROCKELLE HEUERMAN MRN: [id number] Date of Birth: 06/26/1935

## 2020-01-06 ENCOUNTER — Encounter: Payer: Self-pay | Admitting: Physical Therapy

## 2020-01-06 ENCOUNTER — Ambulatory Visit: Payer: Medicare HMO | Admitting: Physical Therapy

## 2020-01-06 ENCOUNTER — Other Ambulatory Visit: Payer: Self-pay

## 2020-01-06 DIAGNOSIS — R296 Repeated falls: Secondary | ICD-10-CM

## 2020-01-06 DIAGNOSIS — M5441 Lumbago with sciatica, right side: Secondary | ICD-10-CM

## 2020-01-06 DIAGNOSIS — M6281 Muscle weakness (generalized): Secondary | ICD-10-CM

## 2020-01-06 NOTE — Therapy (Signed)
[facility] [address] [place], [place], [address] Phone: [phone]   Fax:  [phone]  Physical Therapy Treatment  Patient Details  Name: Linda Lawson MRN: [id number] Date of Birth: 02/08/1936 Referring Provider (PT): Lucia Gaskins   Encounter Date: 01/06/2020   PT End of Session - 01/06/20 1054    Visit Number 4    Date for PT Re-Evaluation 02/23/20    Authorization Type [employer]    PT Start Time 1515    PT Stop Time 1057    PT Time Calculation (min) 1182 min    Activity Tolerance Patient tolerated treatment well    Behavior During Therapy [facility] for tasks assessed/performed           Past Medical History:  Diagnosis Date  . Arthritis   . Complication of anesthesia   . Depression    situatinal  . Diabetes mellitus without complication (HCC)    Type II  . Elevated CK   . Essential hypertension   . Gout   . Hip joint replacement status   . History of aortic valvular stenosis - s/p AVR 2010   s/p AVR (tissue) 2010; normal valve function by echo in 2016  . Hyperlipidemia   . Pneumonia 2010  . PONV (postoperative nausea and vomiting)   . Premature atrial contractions - asymptomatic 08/24/2016  . Primary osteoarthritis of left hip   . Status post aortic valve replacement with bioprosthetic valve 08/24/2008    Past Surgical History:  Procedure Laterality Date  . ABDOMINAL HYSTERECTOMY    . AORTIC VALVE REPLACEMENT  2010   Bioprosthetic AVR [facility])  . CARDIOLITE NUCLEAR STRESS TEST  10/28/2017    [facility]): EF 70%.  No ischemia or infarction.  . COLONOSCOPY    . HIP ARTHROPLASTY Right 2002  . JOINT REPLACEMENT    . KNEE ARTHROPLASTY Left 2012  . LEFT HEART CATH AND CORONARY ANGIOGRAPHY  2010   Nonobstructive CAD as part of preop AVR surgery  . TOTAL HIP ARTHROPLASTY Left 04/19/2016   Procedure: LEFT TOTAL HIP ARTHROPLASTY ANTERIOR APPROACH;  Surgeon: Tarry Kos, MD;  Location: [facility]  OR;  Service: Orthopedics;  Laterality: Left;  . TRANSTHORACIC ECHOCARDIOGRAM  12/04/2017   [facility]): Normal LV size and function with no segmental wall motion abnormality.  Mild concentric LVH.  EF 60 to 65%.  Normally functioning bioprosthetic aortic valve with only mild gradient (mean gradient 15 mmHg).  Mild MR.     There were no vitals filed for this visit.   Subjective Assessment - 01/06/20 1015    Subjective good after last session. Ok so far, its not my body it is my mind    Currently in Pain? No/denies                             [facility] Adult PT Treatment/Exercise - 01/06/20 0001      Ambulation/Gait   Stairs Yes    Stairs Assistance 5: Supervision;4: Min guard    Stair Management Technique One rail Right;One rail Left;Alternating pattern;Step to pattern    Number of Stairs 24    Height of Stairs 6    Gait Comments initial step to pattern descending with RLE. Elects to turn side ways when descending with LLE, R knee crepitus and weakness noted with eccentric load. Min guard needed to maintain alt pattern      High Level Balance  High Level Balance Activities Side stepping;Backward walking    High Level Balance Comments alt 4in box taps x10,x5; Alt taps foam roll x5      Lumbar Exercises: Aerobic   Nustep L5 x 6 min      Lumbar Exercises: Machines for Strengthening   Cybex Knee Flexion 20lb 2x10       Lumbar Exercises: Seated   Sit to Stand 5 reps   x3 from mat table    Other Seated Lumbar Exercises ball squeezes 2x10                    PT Short Term Goals - 12/29/19 1142      PT SHORT TERM GOAL #1   Title Pt will be independent with HEP    Status Achieved             PT Long Term Goals - 01/06/20 1055      PT LONG TERM GOAL #1   Title Pt will demo ability to ascend/descend 1 flight of stairs with HR and superivison assist in order to safely access home.    Status On-going      PT LONG TERM GOAL #2   Title Pt will  demonstrate BLE MMT 4+/5    Status On-going      PT LONG TERM GOAL #3   Title Pt will demo lumbar ROM improved 25%    Status On-going                 Plan - 01/06/20 1055    Clinical Impression Statement Progressed to stair negotiation eccentric load weakness in R quad as well as some crepitus. Some instability noted with alt taps when having to stand on RLE tapping with L. Cues needed with sit to stand not to allow LE to push against table. Cues to control resistance with curls and extensions.    Personal Factors and Comorbidities Age;Comorbidity 3+    Comorbidities arthritis, diabetes, HTN    Examination-Activity Limitations Bend;Carry;Lift;Stand;Stairs    Examination-Participation Restrictions Cleaning;Community Activity;Interpersonal Relationship;Laundry    Stability/Clinical Decision Making Evolving/Moderate complexity    Rehab Potential Good    PT Frequency 2x / week    PT Duration 8 weeks    PT Treatment/Interventions ADLs/Self Care Home Management;Electrical Stimulation;Moist Heat;Iontophoresis 4mg /ml Dexamethasone;Gait training;Stair training;Functional mobility training;Therapeutic activities;Therapeutic exercise;Balance training;Neuromuscular re-education;Manual techniques;Patient/family education;Passive range of motion;Dry needling    PT Next Visit Plan balance testing, lumbar flexibility, LE strength/stretching, incorporate balance and stair training to address frequent falling           Patient will benefit from skilled therapeutic intervention in order to improve the following deficits and impairments:  Abnormal gait, Decreased range of motion, Difficulty walking, Decreased safety awareness, Pain, Decreased balance, Impaired flexibility, Decreased strength, Impaired sensation  Visit Diagnosis: Acute right-sided low back pain with right-sided sciatica  Muscle weakness (generalized)  Frequent falls     Problem List Patient Active Problem List   Diagnosis  Date Noted  . Chronic right-sided low back pain without sciatica 12/10/2019  . Hyperlipidemia LDL goal <100 08/06/2018    Class: Diagnosis of  . DOE (dyspnea on exertion) 08/06/2018  . Left hip pain 02/28/2017  . Abnormal electrocardiogram (ECG) (EKG) 08/26/2016  . Premature atrial contractions - asymptomatic 08/24/2016  . Essential hypertension   . Hip joint replacement status 04/19/2016  . Primary osteoarthritis of left hip   . Status post aortic valve replacement with bioprosthetic valve 08/24/2008    08/26/2008  Shari Prows, PTA 01/06/2020, 10:58 AM  [facility] [address] [place], [place], [address] Phone: [phone]   Fax:  [phone]  Name: AMENAH TUCCI MRN: [id number] Date of Birth: 07/08/1935

## 2020-01-13 ENCOUNTER — Encounter: Payer: Self-pay | Admitting: Physical Therapy

## 2020-01-13 ENCOUNTER — Other Ambulatory Visit: Payer: Self-pay

## 2020-01-13 ENCOUNTER — Ambulatory Visit: Payer: Medicare HMO | Attending: Neurology | Admitting: Physical Therapy

## 2020-01-13 DIAGNOSIS — R296 Repeated falls: Secondary | ICD-10-CM

## 2020-01-13 DIAGNOSIS — M6281 Muscle weakness (generalized): Secondary | ICD-10-CM | POA: Diagnosis not present

## 2020-01-13 DIAGNOSIS — M5441 Lumbago with sciatica, right side: Secondary | ICD-10-CM | POA: Insufficient documentation

## 2020-01-13 NOTE — Therapy (Signed)
[facility] [place] [address] [place], [place], [address] Phone: [phone]   Fax:  [phone]  Physical Therapy Treatment  Patient Details  Name: Linda Lawson MRN: [id number] Date of Birth: 1935/12/31 Referring Provider (PT): Lucia Gaskins   Encounter Date: 01/13/2020   PT End of Session - 01/13/20 1449    Visit Number 5    Date for PT Re-Evaluation 02/23/20    Authorization Type [employer]    PT Start Time 1400    PT Stop Time 1445    PT Time Calculation (min) 45 min    Behavior During Therapy [facility] for tasks assessed/performed           Past Medical History:  Diagnosis Date  . Arthritis   . Complication of anesthesia   . Depression    situatinal  . Diabetes mellitus without complication (HCC)    Type II  . Elevated CK   . Essential hypertension   . Gout   . Hip joint replacement status   . History of aortic valvular stenosis - s/p AVR 2010   s/p AVR (tissue) 2010; normal valve function by echo in 2016  . Hyperlipidemia   . Pneumonia 2010  . PONV (postoperative nausea and vomiting)   . Premature atrial contractions - asymptomatic 08/24/2016  . Primary osteoarthritis of left hip   . Status post aortic valve replacement with bioprosthetic valve 08/24/2008    Past Surgical History:  Procedure Laterality Date  . ABDOMINAL HYSTERECTOMY    . AORTIC VALVE REPLACEMENT  2010   Bioprosthetic AVR [facility])  . CARDIOLITE NUCLEAR STRESS TEST  10/28/2017    [facility]): EF 70%.  No ischemia or infarction.  . COLONOSCOPY    . HIP ARTHROPLASTY Right 2002  . JOINT REPLACEMENT    . KNEE ARTHROPLASTY Left 2012  . LEFT HEART CATH AND CORONARY ANGIOGRAPHY  2010   Nonobstructive CAD as part of preop AVR surgery  . TOTAL HIP ARTHROPLASTY Left 04/19/2016   Procedure: LEFT TOTAL HIP ARTHROPLASTY ANTERIOR APPROACH;  Surgeon: Tarry Kos, MD;  Location: [facility] OR;  Service: Orthopedics;  Laterality: Left;  .  TRANSTHORACIC ECHOCARDIOGRAM  12/04/2017   [facility]): Normal LV size and function with no segmental wall motion abnormality.  Mild concentric LVH.  EF 60 to 65%.  Normally functioning bioprosthetic aortic valve with only mild gradient (mean gradient 15 mmHg).  Mild MR.     There were no vitals filed for this visit.   Subjective Assessment - 01/13/20 1402    Subjective "I am feeling ok" Some stumbles but no falls. PT reports stumbling going up the stairs    Currently in Pain? No/denies              [facility] PT Assessment - 01/13/20 0001      High Level Balance   High Level Balance Activities Side stepping;Backward walking;Negotitating around obstacles;Negotiating over obstacles    High Level Balance Comments alt 6in box taps x10                         [facility] Adult PT Treatment/Exercise - 01/13/20 0001      Lumbar Exercises: Aerobic   Nustep L5 x 6 min      Lumbar Exercises: Machines for Strengthening   Cybex Knee Extension 5lb 2x10    Cybex Knee Flexion 20lb 2x10       Lumbar Exercises: Standing   Other  Standing Lumbar Exercises resisted gait 30lb4 way x 3 each      Lumbar Exercises: Seated   Other Seated Lumbar Exercises ball squeezes 2x10                    PT Short Term Goals - 12/29/19 1142      PT SHORT TERM GOAL #1   Title Pt will be independent with HEP    Status Achieved             PT Long Term Goals - 01/06/20 1055      PT LONG TERM GOAL #1   Title Pt will demo ability to ascend/descend 1 flight of stairs with HR and superivison assist in order to safely access home.    Status On-going      PT LONG TERM GOAL #2   Title Pt will demonstrate BLE MMT 4+/5    Status On-going      PT LONG TERM GOAL #3   Title Pt will demo lumbar ROM improved 25%    Status On-going                 Plan - 01/13/20 1450    Clinical Impression Statement Pt did well overall today's completing the interventions. Cues needed to  complete full ROM with seated leg extensions and curls. Some clearance issues noted with LLE during the 6in alt box taps. Some instability noted during the eccentric phase of resisted gait mote with R side steps.    Personal Factors and Comorbidities Age;Comorbidity 3+    Comorbidities arthritis, diabetes, HTN    Examination-Activity Limitations Bend;Carry;Lift;Stand;Stairs    Examination-Participation Restrictions Cleaning;Community Activity;Interpersonal Relationship;Laundry    Stability/Clinical Decision Making Evolving/Moderate complexity    Rehab Potential Good    PT Frequency 2x / week    PT Duration 8 weeks    PT Treatment/Interventions ADLs/Self Care Home Management;Electrical Stimulation;Moist Heat;Iontophoresis 4mg /ml Dexamethasone;Gait training;Stair training;Functional mobility training;Therapeutic activities;Therapeutic exercise;Balance training;Neuromuscular re-education;Manual techniques;Patient/family education;Passive range of motion;Dry needling    PT Next Visit Plan balance testing, lumbar flexibility, LE strength/stretching, incorporate balance and stair training to address frequent falling           Patient will benefit from skilled therapeutic intervention in order to improve the following deficits and impairments:  Abnormal gait, Decreased range of motion, Difficulty walking, Decreased safety awareness, Pain, Decreased balance, Impaired flexibility, Decreased strength, Impaired sensation  Visit Diagnosis: Muscle weakness (generalized)  Frequent falls  Acute right-sided low back pain with right-sided sciatica     Problem List Patient Active Problem List   Diagnosis Date Noted  . Chronic right-sided low back pain without sciatica 12/10/2019  . Hyperlipidemia LDL goal <100 08/06/2018    Class: Diagnosis of  . DOE (dyspnea on exertion) 08/06/2018  . Left hip pain 02/28/2017  . Abnormal electrocardiogram (ECG) (EKG) 08/26/2016  . Premature atrial contractions -  asymptomatic 08/24/2016  . Essential hypertension   . Hip joint replacement status 04/19/2016  . Primary osteoarthritis of left hip   . Status post aortic valve replacement with bioprosthetic valve 08/24/2008    08/26/2008 01/13/2020, 2:53 PM  [facility] [place] [facility] [address] [place], [place], [place] Phone: [phone]   Fax:  [phone]  Name: VELENCIA LENART MRN: Neldon Mc Date of Birth: 04-18-1936

## 2020-01-15 ENCOUNTER — Encounter: Payer: Medicare HMO | Admitting: Physical Therapy

## 2020-01-15 DIAGNOSIS — N19 Unspecified kidney failure: Secondary | ICD-10-CM | POA: Diagnosis not present

## 2020-01-15 DIAGNOSIS — R748 Abnormal levels of other serum enzymes: Secondary | ICD-10-CM | POA: Diagnosis not present

## 2020-01-15 DIAGNOSIS — E1165 Type 2 diabetes mellitus with hyperglycemia: Secondary | ICD-10-CM | POA: Diagnosis not present

## 2020-01-15 DIAGNOSIS — E782 Mixed hyperlipidemia: Secondary | ICD-10-CM | POA: Diagnosis not present

## 2020-01-19 ENCOUNTER — Ambulatory Visit: Payer: Medicare HMO | Admitting: Physical Therapy

## 2020-01-19 ENCOUNTER — Encounter: Payer: Self-pay | Admitting: Physical Therapy

## 2020-01-19 ENCOUNTER — Other Ambulatory Visit: Payer: Self-pay

## 2020-01-19 DIAGNOSIS — M6281 Muscle weakness (generalized): Secondary | ICD-10-CM | POA: Diagnosis not present

## 2020-01-19 DIAGNOSIS — M5441 Lumbago with sciatica, right side: Secondary | ICD-10-CM

## 2020-01-19 DIAGNOSIS — R296 Repeated falls: Secondary | ICD-10-CM | POA: Diagnosis not present

## 2020-01-19 NOTE — Therapy (Signed)
[facility] [place] [address] [place], [place], [address] Phone: [phone]   Fax:  [phone]  Physical Therapy Treatment  Patient Details  Name: Linda Lawson MRN: [id number] Date of Birth: 09-Jul-1935 Referring Provider (PT): Lucia Gaskins   Encounter Date: 01/19/2020   PT End of Session - 01/19/20 1111    Visit Number 6    Number of Visits 12    Date for PT Re-Evaluation 02/23/20    Authorization Type [employer]    PT Start Time 1030    PT Stop Time 1109    PT Time Calculation (min) 39 min    Activity Tolerance Patient tolerated treatment well    Behavior During Therapy [facility] for tasks assessed/performed           Past Medical History:  Diagnosis Date  . Arthritis   . Complication of anesthesia   . Depression    situatinal  . Diabetes mellitus without complication (HCC)    Type II  . Elevated CK   . Essential hypertension   . Gout   . Hip joint replacement status   . History of aortic valvular stenosis - s/p AVR 2010   s/p AVR (tissue) 2010; normal valve function by echo in 2016  . Hyperlipidemia   . Pneumonia 2010  . PONV (postoperative nausea and vomiting)   . Premature atrial contractions - asymptomatic 08/24/2016  . Primary osteoarthritis of left hip   . Status post aortic valve replacement with bioprosthetic valve 08/24/2008    Past Surgical History:  Procedure Laterality Date  . ABDOMINAL HYSTERECTOMY    . AORTIC VALVE REPLACEMENT  2010   Bioprosthetic AVR [facility])  . CARDIOLITE NUCLEAR STRESS TEST  10/28/2017    [facility]): EF 70%.  No ischemia or infarction.  . COLONOSCOPY    . HIP ARTHROPLASTY Right 2002  . JOINT REPLACEMENT    . KNEE ARTHROPLASTY Left 2012  . LEFT HEART CATH AND CORONARY ANGIOGRAPHY  2010   Nonobstructive CAD as part of preop AVR surgery  . TOTAL HIP ARTHROPLASTY Left 04/19/2016   Procedure: LEFT TOTAL HIP ARTHROPLASTY ANTERIOR APPROACH;  Surgeon: Tarry Kos, MD;  Location: [facility];  Service: Orthopedics;  Laterality: Left;  . TRANSTHORACIC ECHOCARDIOGRAM  12/04/2017   [facility]): Normal LV size and function with no segmental wall motion abnormality.  Mild concentric LVH.  EF 60 to 65%.  Normally functioning bioprosthetic aortic valve with only mild gradient (mean gradient 15 mmHg).  Mild MR.     There were no vitals filed for this visit.   Subjective Assessment - 01/19/20 1032    Subjective Pt reports no falls recently; pt states she is feeling a little better    Currently in Pain? Yes    Pain Score 8    states she is having "stiffness"   Pain Location Back                             [facility] Adult PT Treatment/Exercise - 01/19/20 0001      Lumbar Exercises: Aerobic   Nustep L5 x 7 min      Lumbar Exercises: Machines for Strengthening   Cybex Knee Extension 5lb 2x10    Cybex Knee Flexion 20lb 2x10       Lumbar Exercises: Standing   Heel Raises 10 reps    Heel Raises Limitations 2#    Other Standing Lumbar  Exercises 2# standing hip ext/abd/flex       Lumbar Exercises: Seated   Sit to Stand 20 reps   with yellow ball chest press   Other Seated Lumbar Exercises ball squeezes 2x10                    PT Short Term Goals - 12/29/19 1142      PT SHORT TERM GOAL #1   Title Pt will be independent with HEP    Status Achieved             PT Long Term Goals - 01/06/20 1055      PT LONG TERM GOAL #1   Title Pt will demo ability to ascend/descend 1 flight of stairs with HR and superivison assist in order to safely access home.    Status On-going      PT LONG TERM GOAL #2   Title Pt will demonstrate BLE MMT 4+/5    Status On-going      PT LONG TERM GOAL #3   Title Pt will demo lumbar ROM improved 25%    Status On-going                 Plan - 01/19/20 1112    Clinical Impression Statement Pt tolerated progression of TE well with no complaints of increased pain during exercise. Some  instability noted with step taps; CGA and HH assist to complete. Did well with standing hip ex's; some cuing to decrease compensations.    PT Treatment/Interventions ADLs/Self Care Home Management;Electrical Stimulation;Moist Heat;Iontophoresis 4mg /ml Dexamethasone;Gait training;Stair training;Functional mobility training;Therapeutic activities;Therapeutic exercise;Balance training;Neuromuscular re-education;Manual techniques;Patient/family education;Passive range of motion;Dry needling    PT Next Visit Plan balance testing, lumbar flexibility, LE strength/stretching, incorporate balance and stair training to address frequent falling    Consulted and Agree with Plan of Care Patient           Patient will benefit from skilled therapeutic intervention in order to improve the following deficits and impairments:  Abnormal gait, Decreased range of motion, Difficulty walking, Decreased safety awareness, Pain, Decreased balance, Impaired flexibility, Decreased strength, Impaired sensation  Visit Diagnosis: Muscle weakness (generalized)  Frequent falls  Acute right-sided low back pain with right-sided sciatica     Problem List Patient Active Problem List   Diagnosis Date Noted  . Chronic right-sided low back pain without sciatica 12/10/2019  . Hyperlipidemia LDL goal <100 08/06/2018    Class: Diagnosis of  . DOE (dyspnea on exertion) 08/06/2018  . Left hip pain 02/28/2017  . Abnormal electrocardiogram (ECG) (EKG) 08/26/2016  . Premature atrial contractions - asymptomatic 08/24/2016  . Essential hypertension   . Hip joint replacement status 04/19/2016  . Primary osteoarthritis of left hip   . Status post aortic valve replacement with bioprosthetic valve 08/24/2008   08/26/2008, PT, DPT Lysle Rubens Deidre Carino 01/19/2020, 11:16 AM  [facility] [place] [address], [place], [place] Phone: [phone]   Fax:  [phone]  Name:  LISMARY KIEHN MRN: Neldon Mc Date of Birth: 1935-12-20

## 2020-01-20 DIAGNOSIS — E1169 Type 2 diabetes mellitus with other specified complication: Secondary | ICD-10-CM | POA: Diagnosis not present

## 2020-01-20 DIAGNOSIS — I10 Essential (primary) hypertension: Secondary | ICD-10-CM | POA: Diagnosis not present

## 2020-01-20 DIAGNOSIS — E1165 Type 2 diabetes mellitus with hyperglycemia: Secondary | ICD-10-CM | POA: Diagnosis not present

## 2020-01-20 DIAGNOSIS — E782 Mixed hyperlipidemia: Secondary | ICD-10-CM | POA: Diagnosis not present

## 2020-01-20 DIAGNOSIS — E11319 Type 2 diabetes mellitus with unspecified diabetic retinopathy without macular edema: Secondary | ICD-10-CM | POA: Diagnosis not present

## 2020-01-20 DIAGNOSIS — M109 Gout, unspecified: Secondary | ICD-10-CM | POA: Diagnosis not present

## 2020-01-20 DIAGNOSIS — Z23 Encounter for immunization: Secondary | ICD-10-CM | POA: Diagnosis not present

## 2020-01-21 ENCOUNTER — Other Ambulatory Visit: Payer: Self-pay

## 2020-01-21 ENCOUNTER — Ambulatory Visit: Payer: Medicare HMO | Admitting: Physical Therapy

## 2020-01-21 ENCOUNTER — Encounter: Payer: Self-pay | Admitting: Physical Therapy

## 2020-01-21 DIAGNOSIS — M5441 Lumbago with sciatica, right side: Secondary | ICD-10-CM | POA: Diagnosis not present

## 2020-01-21 DIAGNOSIS — R296 Repeated falls: Secondary | ICD-10-CM | POA: Diagnosis not present

## 2020-01-21 DIAGNOSIS — M6281 Muscle weakness (generalized): Secondary | ICD-10-CM | POA: Diagnosis not present

## 2020-01-21 NOTE — Therapy (Signed)
[place], [place], [address] Phone: [phone]   Fax:  [phone]  Physical Therapy Treatment  Patient Details  Name: Linda Lawson MRN: [id number] Date of Birth: January 10, 1936 Referring Provider (PT): Jaynee Eagles   Encounter Date: 01/21/2020   PT End of Session - 01/21/20 1108    Visit Number 7    Number of Visits 12    Date for PT Re-Evaluation 02/23/20    PT Start Time 1016    PT Stop Time 1057    PT Time Calculation (min) 41 min    Activity Tolerance Patient tolerated treatment well    Behavior During Therapy [facility] for tasks assessed/performed           Past Medical History:  Diagnosis Date  . Arthritis   . Complication of anesthesia   . Depression    situatinal  . Diabetes mellitus without complication ([place])    Type II  . Elevated CK   . Essential hypertension   . Gout   . Hip joint replacement status   . History of aortic valvular stenosis - s/p AVR 2010   s/p AVR (tissue) 2010; normal valve function by echo in 2016  . Hyperlipidemia   . Pneumonia 2010  . PONV (postoperative nausea and vomiting)   . Premature atrial contractions - asymptomatic 08/24/2016  . Primary osteoarthritis of left hip   . Status post aortic valve replacement with bioprosthetic valve 08/24/2008    Past Surgical History:  Procedure Laterality Date  . ABDOMINAL HYSTERECTOMY    . AORTIC VALVE REPLACEMENT  2010   Bioprosthetic AVR [facility])  . CARDIOLITE NUCLEAR STRESS TEST  10/28/2017    ([place]): EF 70%.  No ischemia or infarction.  . COLONOSCOPY    . HIP ARTHROPLASTY Right 2002  . JOINT REPLACEMENT    . KNEE ARTHROPLASTY Left 2012  . LEFT HEART CATH AND CORONARY ANGIOGRAPHY  2010   Nonobstructive CAD as part of preop AVR surgery  . TOTAL HIP ARTHROPLASTY Left 04/19/2016   Procedure: LEFT TOTAL HIP ARTHROPLASTY ANTERIOR APPROACH;  Surgeon: Leandrew Koyanagi, MD;  Location: [place];   Service: Orthopedics;  Laterality: Left;  . TRANSTHORACIC ECHOCARDIOGRAM  12/04/2017   [facility] [place]): Normal LV size and function with no segmental wall motion abnormality.  Mild concentric LVH.  EF 60 to 65%.  Normally functioning bioprosthetic aortic valve with only mild gradient (mean gradient 15 mmHg).  Mild MR.     There were no vitals filed for this visit.   Subjective Assessment - 01/21/20 1019    Subjective Pt reports feeling good this morning with no pain    Currently in Pain? No/denies    Pain Score 0-No pain    Pain Location Back                             [facility] Adult PT Treatment/Exercise - 01/21/20 0001      Lumbar Exercises: Aerobic   Nustep L5 x 8 min      Lumbar Exercises: Machines for Strengthening   Cybex Knee Extension 5lb 2x10    Cybex Knee Flexion 20lb 2x10     Leg Press 20# 2x10 BLE      Lumbar Exercises: Standing   Heel Raises 15 reps    Heel Raises Limitations at black bar    Other Standing Lumbar Exercises 2# standing hip ext/abd/flex  knee flex      Lumbar Exercises: Seated   Sit to Stand 20 reps   yellow ball chest press                   PT Short Term Goals - 12/29/19 1142      PT SHORT TERM GOAL #1   Title Pt will be independent with HEP    Status Achieved             PT Long Term Goals - 01/21/20 1111      PT LONG TERM GOAL #1   Title Pt will demo ability to ascend/descend 1 flight of stairs with HR and superivison assist in order to safely access home.    Status Achieved      PT LONG TERM GOAL #2   Title Pt will demonstrate BLE MMT 4+/5    Status Partially Met      PT LONG TERM GOAL #3   Title Pt will demo lumbar ROM improved 25%    Status Achieved      PT LONG TERM GOAL #4   Title Pt will report 50% reduction in LBP    Status Achieved                 Plan - 01/21/20 1109    Clinical Impression Statement Pt reported no pain or weakness during this rx. Still has some instability  with gait and required cuing for compensations with standing hip ex's. Pt requested to be put on hold for PT; wants to try to do ex's at home. Educated on when to call back and continuance of HEP with pt verbalized understanding/agreement.    PT Treatment/Interventions ADLs/Self Care Home Management;Electrical Stimulation;Moist Heat;Iontophoresis 44m/ml Dexamethasone;Gait training;Stair training;Functional mobility training;Therapeutic activities;Therapeutic exercise;Balance training;Neuromuscular re-education;Manual techniques;Patient/family education;Passive range of motion;Dry needling    PT Next Visit Plan balance testing, lumbar flexibility, LE strength/stretching, incorporate balance and stair training to address frequent falling    Consulted and Agree with Plan of Care Patient           Patient will benefit from skilled therapeutic intervention in order to improve the following deficits and impairments:  Abnormal gait, Decreased range of motion, Difficulty walking, Decreased safety awareness, Pain, Decreased balance, Impaired flexibility, Decreased strength, Impaired sensation  Visit Diagnosis: Muscle weakness (generalized)  Frequent falls  Acute right-sided low back pain with right-sided sciatica     Problem List Patient Active Problem List   Diagnosis Date Noted  . Chronic right-sided low back pain without sciatica 12/10/2019  . Hyperlipidemia LDL goal <100 08/06/2018    Class: Diagnosis of  . DOE (dyspnea on exertion) 08/06/2018  . Left hip pain 02/28/2017  . Abnormal electrocardiogram (ECG) (EKG) 08/26/2016  . Premature atrial contractions - asymptomatic 08/24/2016  . Essential hypertension   . Hip joint replacement status 04/19/2016  . Primary osteoarthritis of left hip   . Status post aortic valve replacement with bioprosthetic valve 08/24/2008   AAmador Lawson PT, DPT ADonald ProseSugg 01/21/2020, 11:13 AM  [place] [address]Phone: [phone]  Fax:  [phone] Name: Linda ADEEMRN: [id number] of Birth: [birth year]

## 2020-04-18 DIAGNOSIS — E1165 Type 2 diabetes mellitus with hyperglycemia: Secondary | ICD-10-CM | POA: Diagnosis not present

## 2020-04-18 DIAGNOSIS — I10 Essential (primary) hypertension: Secondary | ICD-10-CM | POA: Diagnosis not present

## 2020-04-21 DIAGNOSIS — E1159 Type 2 diabetes mellitus with other circulatory complications: Secondary | ICD-10-CM | POA: Diagnosis not present

## 2020-04-21 DIAGNOSIS — E782 Mixed hyperlipidemia: Secondary | ICD-10-CM | POA: Diagnosis not present

## 2020-04-21 DIAGNOSIS — I10 Essential (primary) hypertension: Secondary | ICD-10-CM | POA: Diagnosis not present

## 2020-04-21 DIAGNOSIS — N189 Chronic kidney disease, unspecified: Secondary | ICD-10-CM | POA: Diagnosis not present

## 2020-04-21 DIAGNOSIS — E1169 Type 2 diabetes mellitus with other specified complication: Secondary | ICD-10-CM | POA: Diagnosis not present

## 2020-04-21 DIAGNOSIS — E1165 Type 2 diabetes mellitus with hyperglycemia: Secondary | ICD-10-CM | POA: Diagnosis not present

## 2020-05-03 ENCOUNTER — Ambulatory Visit: Payer: Medicare HMO | Admitting: Podiatry

## 2020-07-27 DIAGNOSIS — Z0000 Encounter for general adult medical examination without abnormal findings: Secondary | ICD-10-CM | POA: Diagnosis not present

## 2020-07-27 DIAGNOSIS — Z1331 Encounter for screening for depression: Secondary | ICD-10-CM | POA: Diagnosis not present

## 2020-07-27 DIAGNOSIS — Z1339 Encounter for screening examination for other mental health and behavioral disorders: Secondary | ICD-10-CM | POA: Diagnosis not present

## 2020-07-28 ENCOUNTER — Other Ambulatory Visit: Payer: Self-pay | Admitting: Family Medicine

## 2020-07-28 DIAGNOSIS — E2839 Other primary ovarian failure: Secondary | ICD-10-CM

## 2020-08-25 DIAGNOSIS — H2513 Age-related nuclear cataract, bilateral: Secondary | ICD-10-CM | POA: Diagnosis not present

## 2020-08-25 DIAGNOSIS — H52203 Unspecified astigmatism, bilateral: Secondary | ICD-10-CM | POA: Diagnosis not present

## 2020-08-25 DIAGNOSIS — E119 Type 2 diabetes mellitus without complications: Secondary | ICD-10-CM | POA: Diagnosis not present

## 2020-08-25 DIAGNOSIS — H5203 Hypermetropia, bilateral: Secondary | ICD-10-CM | POA: Diagnosis not present

## 2020-09-01 DIAGNOSIS — I10 Essential (primary) hypertension: Secondary | ICD-10-CM | POA: Diagnosis not present

## 2020-09-01 DIAGNOSIS — E1165 Type 2 diabetes mellitus with hyperglycemia: Secondary | ICD-10-CM | POA: Diagnosis not present

## 2020-09-01 DIAGNOSIS — E782 Mixed hyperlipidemia: Secondary | ICD-10-CM | POA: Diagnosis not present

## 2020-09-05 DIAGNOSIS — E782 Mixed hyperlipidemia: Secondary | ICD-10-CM | POA: Diagnosis not present

## 2020-09-05 DIAGNOSIS — I10 Essential (primary) hypertension: Secondary | ICD-10-CM | POA: Diagnosis not present

## 2020-09-05 DIAGNOSIS — E1159 Type 2 diabetes mellitus with other circulatory complications: Secondary | ICD-10-CM | POA: Diagnosis not present

## 2020-09-05 DIAGNOSIS — E1165 Type 2 diabetes mellitus with hyperglycemia: Secondary | ICD-10-CM | POA: Diagnosis not present

## 2020-09-05 DIAGNOSIS — E1169 Type 2 diabetes mellitus with other specified complication: Secondary | ICD-10-CM | POA: Diagnosis not present

## 2021-01-25 ENCOUNTER — Ambulatory Visit
Admission: RE | Admit: 2021-01-25 | Discharge: 2021-01-25 | Disposition: A | Discharge status: Home / Self Care | Payer: Medicare HMO | Source: Ambulatory Visit | Attending: Family Medicine | Admitting: Family Medicine

## 2021-01-25 ENCOUNTER — Other Ambulatory Visit: Payer: Self-pay

## 2021-01-25 DIAGNOSIS — Z780 Asymptomatic menopausal state: Secondary | ICD-10-CM | POA: Diagnosis not present

## 2021-01-25 DIAGNOSIS — E2839 Other primary ovarian failure: Secondary | ICD-10-CM

## 2021-01-25 DIAGNOSIS — M85832 Other specified disorders of bone density and structure, left forearm: Secondary | ICD-10-CM | POA: Diagnosis not present

## 2021-02-06 DIAGNOSIS — M8580 Other specified disorders of bone density and structure, unspecified site: Secondary | ICD-10-CM | POA: Diagnosis not present

## 2021-02-06 DIAGNOSIS — I10 Essential (primary) hypertension: Secondary | ICD-10-CM | POA: Diagnosis not present

## 2021-02-06 DIAGNOSIS — E559 Vitamin D deficiency, unspecified: Secondary | ICD-10-CM | POA: Diagnosis not present

## 2021-02-06 DIAGNOSIS — M169 Osteoarthritis of hip, unspecified: Secondary | ICD-10-CM | POA: Diagnosis not present

## 2021-02-06 DIAGNOSIS — M810 Age-related osteoporosis without current pathological fracture: Secondary | ICD-10-CM | POA: Diagnosis not present

## 2021-02-09 DIAGNOSIS — Z23 Encounter for immunization: Secondary | ICD-10-CM | POA: Diagnosis not present

## 2021-03-20 DIAGNOSIS — M109 Gout, unspecified: Secondary | ICD-10-CM | POA: Diagnosis not present

## 2021-03-20 DIAGNOSIS — E559 Vitamin D deficiency, unspecified: Secondary | ICD-10-CM | POA: Diagnosis not present

## 2021-03-20 DIAGNOSIS — E782 Mixed hyperlipidemia: Secondary | ICD-10-CM | POA: Diagnosis not present

## 2021-03-20 DIAGNOSIS — M25559 Pain in unspecified hip: Secondary | ICD-10-CM | POA: Diagnosis not present

## 2021-04-13 DIAGNOSIS — E782 Mixed hyperlipidemia: Secondary | ICD-10-CM | POA: Diagnosis not present

## 2021-04-13 DIAGNOSIS — E559 Vitamin D deficiency, unspecified: Secondary | ICD-10-CM | POA: Diagnosis not present

## 2021-04-13 DIAGNOSIS — D649 Anemia, unspecified: Secondary | ICD-10-CM | POA: Diagnosis not present

## 2021-04-13 DIAGNOSIS — I10 Essential (primary) hypertension: Secondary | ICD-10-CM | POA: Diagnosis not present

## 2021-04-13 DIAGNOSIS — M109 Gout, unspecified: Secondary | ICD-10-CM | POA: Diagnosis not present

## 2021-04-17 DIAGNOSIS — I152 Hypertension secondary to endocrine disorders: Secondary | ICD-10-CM | POA: Diagnosis not present

## 2021-04-17 DIAGNOSIS — L309 Dermatitis, unspecified: Secondary | ICD-10-CM | POA: Diagnosis not present

## 2021-04-17 DIAGNOSIS — M170 Bilateral primary osteoarthritis of knee: Secondary | ICD-10-CM | POA: Diagnosis not present

## 2021-04-17 DIAGNOSIS — E559 Vitamin D deficiency, unspecified: Secondary | ICD-10-CM | POA: Diagnosis not present

## 2021-04-17 DIAGNOSIS — I10 Essential (primary) hypertension: Secondary | ICD-10-CM | POA: Diagnosis not present

## 2021-04-17 DIAGNOSIS — E782 Mixed hyperlipidemia: Secondary | ICD-10-CM | POA: Diagnosis not present

## 2021-04-17 DIAGNOSIS — E1165 Type 2 diabetes mellitus with hyperglycemia: Secondary | ICD-10-CM | POA: Diagnosis not present

## 2021-04-17 DIAGNOSIS — M109 Gout, unspecified: Secondary | ICD-10-CM | POA: Diagnosis not present

## 2021-06-21 DIAGNOSIS — R0602 Shortness of breath: Secondary | ICD-10-CM | POA: Diagnosis not present

## 2021-06-21 DIAGNOSIS — Z20822 Contact with and (suspected) exposure to covid-19: Secondary | ICD-10-CM | POA: Diagnosis not present

## 2021-06-21 DIAGNOSIS — Y999 Unspecified external cause status: Secondary | ICD-10-CM | POA: Diagnosis not present

## 2021-06-21 DIAGNOSIS — S46911A Strain of unspecified muscle, fascia and tendon at shoulder and upper arm level, right arm, initial encounter: Secondary | ICD-10-CM | POA: Diagnosis not present

## 2021-06-21 DIAGNOSIS — I491 Atrial premature depolarization: Secondary | ICD-10-CM | POA: Diagnosis not present

## 2021-06-21 DIAGNOSIS — I493 Ventricular premature depolarization: Secondary | ICD-10-CM | POA: Diagnosis not present

## 2021-06-21 DIAGNOSIS — R319 Hematuria, unspecified: Secondary | ICD-10-CM | POA: Diagnosis not present

## 2021-06-21 DIAGNOSIS — R42 Dizziness and giddiness: Secondary | ICD-10-CM | POA: Diagnosis not present

## 2021-06-21 DIAGNOSIS — R531 Weakness: Secondary | ICD-10-CM | POA: Diagnosis not present

## 2021-06-21 DIAGNOSIS — N390 Urinary tract infection, site not specified: Secondary | ICD-10-CM | POA: Diagnosis not present

## 2021-06-21 DIAGNOSIS — R000 Tachycardia, unspecified: Secondary | ICD-10-CM | POA: Diagnosis not present

## 2021-06-21 DIAGNOSIS — W01198A Fall on same level from slipping, tripping and stumbling with subsequent striking against other object, initial encounter: Secondary | ICD-10-CM | POA: Diagnosis not present

## 2021-06-21 DIAGNOSIS — Z043 Encounter for examination and observation following other accident: Secondary | ICD-10-CM | POA: Diagnosis not present

## 2021-06-22 DIAGNOSIS — I491 Atrial premature depolarization: Secondary | ICD-10-CM | POA: Diagnosis not present

## 2021-06-22 DIAGNOSIS — I493 Ventricular premature depolarization: Secondary | ICD-10-CM | POA: Diagnosis not present

## 2021-07-21 ENCOUNTER — Other Ambulatory Visit: Payer: Self-pay

## 2021-07-21 ENCOUNTER — Ambulatory Visit
Admission: RE | Admit: 2021-07-21 | Discharge: 2021-07-21 | Disposition: A | Discharge status: Home / Self Care | Payer: Medicare HMO | Source: Ambulatory Visit | Attending: Family Medicine | Admitting: Family Medicine

## 2021-07-21 ENCOUNTER — Other Ambulatory Visit: Payer: Self-pay | Admitting: Family Medicine

## 2021-07-21 DIAGNOSIS — M170 Bilateral primary osteoarthritis of knee: Secondary | ICD-10-CM

## 2021-07-21 DIAGNOSIS — M25561 Pain in right knee: Secondary | ICD-10-CM | POA: Diagnosis not present

## 2021-07-21 DIAGNOSIS — M25562 Pain in left knee: Secondary | ICD-10-CM | POA: Diagnosis not present

## 2021-07-21 DIAGNOSIS — N390 Urinary tract infection, site not specified: Secondary | ICD-10-CM | POA: Diagnosis not present

## 2021-07-21 DIAGNOSIS — Z96651 Presence of right artificial knee joint: Secondary | ICD-10-CM | POA: Diagnosis not present

## 2021-07-21 DIAGNOSIS — Z471 Aftercare following joint replacement surgery: Secondary | ICD-10-CM | POA: Diagnosis not present

## 2021-07-21 DIAGNOSIS — F0390 Unspecified dementia without behavioral disturbance: Secondary | ICD-10-CM | POA: Diagnosis not present

## 2021-07-21 DIAGNOSIS — M1711 Unilateral primary osteoarthritis, right knee: Secondary | ICD-10-CM | POA: Diagnosis not present

## 2021-07-21 DIAGNOSIS — I10 Essential (primary) hypertension: Secondary | ICD-10-CM | POA: Diagnosis not present

## 2021-07-21 DIAGNOSIS — Z96652 Presence of left artificial knee joint: Secondary | ICD-10-CM | POA: Diagnosis not present

## 2021-07-21 DIAGNOSIS — E1122 Type 2 diabetes mellitus with diabetic chronic kidney disease: Secondary | ICD-10-CM | POA: Diagnosis not present

## 2021-08-07 DIAGNOSIS — Z0000 Encounter for general adult medical examination without abnormal findings: Secondary | ICD-10-CM | POA: Diagnosis not present

## 2021-08-07 DIAGNOSIS — Z1339 Encounter for screening examination for other mental health and behavioral disorders: Secondary | ICD-10-CM | POA: Diagnosis not present

## 2021-08-07 DIAGNOSIS — Z1331 Encounter for screening for depression: Secondary | ICD-10-CM | POA: Diagnosis not present

## 2021-08-07 DIAGNOSIS — Z9181 History of falling: Secondary | ICD-10-CM | POA: Diagnosis not present

## 2021-08-07 DIAGNOSIS — E119 Type 2 diabetes mellitus without complications: Secondary | ICD-10-CM | POA: Diagnosis not present

## 2021-08-11 DIAGNOSIS — E559 Vitamin D deficiency, unspecified: Secondary | ICD-10-CM | POA: Diagnosis not present

## 2021-09-28 DIAGNOSIS — M179 Osteoarthritis of knee, unspecified: Secondary | ICD-10-CM | POA: Diagnosis not present

## 2021-09-28 DIAGNOSIS — I10 Essential (primary) hypertension: Secondary | ICD-10-CM | POA: Diagnosis not present

## 2021-09-28 DIAGNOSIS — F0390 Unspecified dementia without behavioral disturbance: Secondary | ICD-10-CM | POA: Diagnosis not present

## 2021-09-28 DIAGNOSIS — E1165 Type 2 diabetes mellitus with hyperglycemia: Secondary | ICD-10-CM | POA: Diagnosis not present

## 2021-10-23 DIAGNOSIS — E1165 Type 2 diabetes mellitus with hyperglycemia: Secondary | ICD-10-CM | POA: Diagnosis not present

## 2021-10-23 DIAGNOSIS — E782 Mixed hyperlipidemia: Secondary | ICD-10-CM | POA: Diagnosis not present

## 2021-10-23 DIAGNOSIS — I10 Essential (primary) hypertension: Secondary | ICD-10-CM | POA: Diagnosis not present

## 2023-05-10 DIAGNOSIS — E118 Type 2 diabetes mellitus with unspecified complications: Secondary | ICD-10-CM | POA: Diagnosis not present

## 2023-05-10 DIAGNOSIS — R739 Hyperglycemia, unspecified: Secondary | ICD-10-CM | POA: Diagnosis not present

## 2023-05-10 DIAGNOSIS — I10 Essential (primary) hypertension: Secondary | ICD-10-CM | POA: Diagnosis not present

## 2023-05-10 DIAGNOSIS — R4182 Altered mental status, unspecified: Secondary | ICD-10-CM | POA: Diagnosis not present

## 2023-08-05 ENCOUNTER — Ambulatory Visit: Admitting: Podiatry

## 2023-08-05 ENCOUNTER — Encounter: Payer: Self-pay | Admitting: Podiatry

## 2023-08-05 VITALS — Ht 64.0 in | Wt 177.0 lb

## 2023-08-05 DIAGNOSIS — M2011 Hallux valgus (acquired), right foot: Secondary | ICD-10-CM | POA: Diagnosis not present

## 2023-08-05 DIAGNOSIS — M2012 Hallux valgus (acquired), left foot: Secondary | ICD-10-CM | POA: Diagnosis not present

## 2023-08-05 DIAGNOSIS — B351 Tinea unguium: Secondary | ICD-10-CM | POA: Diagnosis not present

## 2023-08-05 DIAGNOSIS — E119 Type 2 diabetes mellitus without complications: Secondary | ICD-10-CM | POA: Diagnosis not present

## 2023-08-05 DIAGNOSIS — Z794 Long term (current) use of insulin: Secondary | ICD-10-CM

## 2023-08-05 DIAGNOSIS — M79674 Pain in right toe(s): Secondary | ICD-10-CM

## 2023-08-05 DIAGNOSIS — M79675 Pain in left toe(s): Secondary | ICD-10-CM

## 2023-08-05 NOTE — Progress Notes (Signed)
 Subjective: Chief Complaint  Patient presents with   Nail Problem    Pt is here for [facility] unsure of last A1C PCP is Dr [name] and LOV was last year.   Linda Lawson presents today for diabetic foot evaluation.  Patient relates h/o diabetes.  Patient denies any h/o foot wounds.  Patient denies any numbness, tingling, burning, or pins/needle sensation in feet.  She is a former smoker.  PCP is Linda Moccasin, MD , and last visit was Sep 28, 2021.  Past Medical History:  Diagnosis Date   Arthritis    Complication of anesthesia    Depression    situatinal   Diabetes mellitus without complication (HCC)    Type II   Elevated CK    Essential hypertension    Gout    Hip joint replacement status    History of aortic valvular stenosis - s/p AVR 2010   s/p AVR (tissue) 2010; normal valve function by echo in 2016   Hyperlipidemia    Pneumonia 2010   PONV (postoperative nausea and vomiting)    Premature atrial contractions - asymptomatic 08/24/2016   Primary osteoarthritis of left hip    Status post aortic valve replacement with bioprosthetic valve 08/24/2008    Patient Active Problem List   Diagnosis Date Noted   Chronic right-sided low back pain without sciatica 12/10/2019   Hyperlipidemia LDL goal <100 08/06/2018    Class: Diagnosis of   DOE (dyspnea on exertion) 08/06/2018   CAD (coronary artery disease) 10/17/2017   Diabetes mellitus (HCC) 10/17/2017   Left hip pain 02/28/2017   Abnormal electrocardiogram (ECG) (EKG) 08/26/2016   Premature atrial contractions - asymptomatic 08/24/2016   Essential hypertension    Hip joint replacement status 04/19/2016   Primary osteoarthritis of left hip    Pre-operative cardiovascular examination, valvular heart disease 03/01/2016   Status post aortic valve replacement with bioprosthetic valve 08/24/2008    Past Surgical History:  Procedure Laterality Date   ABDOMINAL HYSTERECTOMY     AORTIC VALVE REPLACEMENT  2010    Bioprosthetic AVR ([facility])   CARDIOLITE NUCLEAR STRESS TEST  10/28/2017    [facility]): EF 70%.  No ischemia or infarction.   COLONOSCOPY     HIP ARTHROPLASTY Right 2002   JOINT REPLACEMENT     KNEE ARTHROPLASTY Left 2012   LEFT HEART CATH AND CORONARY ANGIOGRAPHY  2010   Nonobstructive CAD as part of preop AVR surgery   TOTAL HIP ARTHROPLASTY Left 04/19/2016   Procedure: LEFT TOTAL HIP ARTHROPLASTY ANTERIOR APPROACH;  Surgeon: Tarry Kos, MD;  [facility];  Service: Orthopedics;  Laterality: Left;   TRANSTHORACIC ECHOCARDIOGRAM  12/04/2017   [facility]): Normal LV size and function with no segmental wall motion abnormality.  Mild concentric LVH.  EF 60 to 65%.  Normally functioning bioprosthetic aortic valve with only mild gradient (mean gradient 15 mmHg).  Mild MR.     Current Outpatient Medications on File Prior to Visit  Medication Sig Dispense Refill   ACCU-CHEK AVIVA PLUS test strip      ACCU-CHEK SOFTCLIX LANCETS lancets      Alcohol Swabs (B-D SINGLE USE SWABS REGULAR) PADS      allopurinol (ZYLOPRIM) 100 MG tablet Take 100 mg by mouth daily.      aspirin EC 81 MG tablet Take 81 mg by mouth daily. Swallow whole.      BD ULTRA-FINE PEN NEEDLES 29G X 12.7MM MISC      diclofenac  sodium (VOLTAREN) 1 % GEL As needed for leg pain     docusate sodium (COLACE) 100 MG capsule Take 200 mg by mouth 2 (two) times daily as needed.     Evolocumab (REPATHA) 140 MG/ML SOSY Inject 1 mL into the skin every 14 (fourteen) days.     LANTUS SOLOSTAR 100 UNIT/ML Solostar Pen Inject 10 Units into the skin daily.      loratadine (CLARITIN) 10 MG tablet Take 10 mg by mouth daily as needed for allergies or rhinitis.     losartan (COZAAR) 50 MG tablet Take 50 mg by mouth daily.      Multiple Vitamin (MULTIVITAMIN PO) Take by mouth daily.     OZEMPIC, 0.25 OR 0.5 MG/DOSE, 2 MG/1.5ML SOPN Inject 0.25 mg into the skin once a week.      No current facility-administered  medications on file prior to visit.     Allergies  Allergen Reactions   Penicillins Swelling and Rash    Has patient had a PCN reaction causing immediate rash, facial/tongue/throat swelling, SOB or lightheadedness with hypotension: Yes Has patient had a PCN reaction causing severe rash involving mucus membranes or skin necrosis: No Has patient had a PCN reaction that required hospitalization: No Has patient had a PCN reaction occurring within the last 10 years: No If all of the above answers are "NO", then may proceed with Cephalosporin use.    Codeine Nausea And Vomiting    Social History   Occupational History   Not on file  Tobacco Use   Smoking status: Former    Current packs/day: 0.00    Types: Cigarettes    Start date: 06/12/1988    Quit date: 06/12/2008    Years since quitting: 15.1   Smokeless tobacco: Never  Vaping Use   Vaping status: Never Used  Substance and Sexual Activity   Alcohol use: No   Drug use: No   Sexual activity: Not on file    Family History  Problem Relation Age of Onset   Diabetes Maternal Grandmother    Heart disease Maternal Grandmother    Diabetes Sister     Immunization History  Administered Date(s) Administered   Influenza-Unspecified 02/11/2013   PFIZER(Purple Top)SARS-COV-2 Vaccination 07/05/2019, 07/29/2019   PPD Test 04/25/2016    Objective: There were no vitals filed for this visit.  Linda Lawson is a pleasant 88 y.o. female WD, WN in NAD. AAO X 3.   Diabetic foot exam was performed with the following findings:   Vascular Examination: CFT <3 seconds b/l. DP/PT pulses faintly palpable b/l. Skin temperature gradient warm to warm b/l. No pain with calf compression. No ischemia or gangrene. No cyanosis or clubbing noted b/l. Pedal hair absent.   Neurological Examination: Sensation grossly intact b/l with 10 gram monofilament. Vibratory sensation intact b/l.   Dermatological Examination: Pedal skin warm and supple b/l.    No open wounds. No interdigital macerations.  Toenails 1-5 b/l thick, discolored, elongated with subungual debris and pain on dorsal palpation.    No corns, calluses nor porokeratotic lesions noted.  Musculoskeletal Examination: Normal muscle strength 5/5 to all lower extremity muscle groups bilaterally. HAV with bunion deformity noted b/l LE.Marland Kitchen No pain, crepitus or joint limitation noted with ROM b/l LE.  Patient ambulates independently without assistive aids.  Radiographs: None      Lab Results  Component Value Date   HGBA1C 7.9 (H) 04/17/2016   Assessment: 1. Pain due to onychomycosis of toenails of both feet  2. Hallux valgus, acquired, bilateral   3. Type 2 diabetes mellitus without complication, with long-term current use of insulin (HCC)   4. Encounter for diabetic foot exam (HCC)     ADA Risk Categorization: Low Risk:  Patient has all of the following: Intact protective sensation No prior foot ulcer  No severe deformity Pedal pulses present  Plan: -Patient's family member present. All questions/concerns addressed on today's visit. -Diabetic foot examination performed today. -Continue diabetic foot care principles: inspect feet daily, monitor glucose as recommended by PCP and/or Endocrinologist, and follow prescribed diet per PCP, Endocrinologist and/or dietician. -Patient to continue soft, supportive shoe gear daily. -Toenails 1-5 bilaterally were debrided in length and girth with sterile nail nippers and dremel. Pinpoint bleeding of R 5th toe addressed with Lumicain Hemostatic Solution, cleansed with alcohol. Triple antibiotic ointment applied. No further treatment required by patient/caregiver. -Patient/POA to call should there be question/concern in the interim.  Return in about 3 months (around 11/05/2023).  Freddie Breech, DPM      [place] LOCATION: [address] [place] [address]                    Office [phone]   [facility] LOCATION: [address] [place], [place] [address] Office [phone]

## 2023-12-27 ENCOUNTER — Encounter: Payer: Self-pay | Admitting: Podiatry

## 2023-12-27 ENCOUNTER — Ambulatory Visit: Admitting: Podiatry

## 2023-12-27 DIAGNOSIS — E119 Type 2 diabetes mellitus without complications: Secondary | ICD-10-CM | POA: Diagnosis not present

## 2023-12-27 DIAGNOSIS — Z794 Long term (current) use of insulin: Secondary | ICD-10-CM

## 2023-12-27 DIAGNOSIS — B351 Tinea unguium: Secondary | ICD-10-CM | POA: Diagnosis not present

## 2023-12-27 DIAGNOSIS — M79675 Pain in left toe(s): Secondary | ICD-10-CM | POA: Diagnosis not present

## 2023-12-27 DIAGNOSIS — M79674 Pain in right toe(s): Secondary | ICD-10-CM | POA: Diagnosis not present

## 2023-12-27 NOTE — Progress Notes (Signed)
 This patient returns to my office for at risk foot care.  This patient requires this care by a professional since this patient will be at risk due to having diabetes.  This patient is unable to cut nails herself since the patient cannot reach her nails.These nails are painful walking and wearing shoes.  This patient presents for at risk foot care today.  General Appearance  Alert, conversant and in no acute stress.  Vascular  Dorsalis pedis and posterior tibial  pulses are palpable  bilaterally.  Capillary return is within normal limits  bilaterally. Temperature is within normal limits  bilaterally.  Neurologic  Senn-Weinstein monofilament wire test within normal limits  bilaterally. Muscle power within normal limits bilaterally.  Nails Thick disfigured discolored nails with subungual debris  from hallux to fifth toes bilaterally. No evidence of bacterial infection or drainage bilaterally.  Orthopedic  No limitations of motion  feet .  No crepitus or effusions noted.  No bony pathology or digital deformities noted.  Skin  normotropic skin with no porokeratosis noted bilaterally.  No signs of infections or ulcers noted.     Onychomycosis  Pain in right toes  Pain in left toes  Consent was obtained for treatment procedures.   Mechanical debridement of nails 1-5  bilaterally performed with a nail nipper.  Filed with dremel without incident.    Return office visit   4 months                   Told patient to return for periodic foot care and evaluation due to potential at risk complications.   Cordella Bold DPM

## 2023-12-31 ENCOUNTER — Encounter: Payer: Self-pay | Admitting: Sports Medicine

## 2023-12-31 ENCOUNTER — Ambulatory Visit (INDEPENDENT_AMBULATORY_CARE_PROVIDER_SITE_OTHER): Admitting: Sports Medicine

## 2023-12-31 VITALS — BP 156/82 | HR 76 | Temp 97.8°F | Ht 64.0 in | Wt 151.6 lb

## 2023-12-31 DIAGNOSIS — I10 Essential (primary) hypertension: Secondary | ICD-10-CM

## 2023-12-31 DIAGNOSIS — E538 Deficiency of other specified B group vitamins: Secondary | ICD-10-CM

## 2023-12-31 DIAGNOSIS — R5383 Other fatigue: Secondary | ICD-10-CM

## 2023-12-31 DIAGNOSIS — Z780 Asymptomatic menopausal state: Secondary | ICD-10-CM

## 2023-12-31 DIAGNOSIS — E782 Mixed hyperlipidemia: Secondary | ICD-10-CM

## 2023-12-31 DIAGNOSIS — E119 Type 2 diabetes mellitus without complications: Secondary | ICD-10-CM | POA: Diagnosis not present

## 2023-12-31 DIAGNOSIS — R413 Other amnesia: Secondary | ICD-10-CM | POA: Diagnosis not present

## 2023-12-31 DIAGNOSIS — E559 Vitamin D deficiency, unspecified: Secondary | ICD-10-CM

## 2023-12-31 DIAGNOSIS — D649 Anemia, unspecified: Secondary | ICD-10-CM | POA: Diagnosis not present

## 2023-12-31 MED ORDER — LOSARTAN POTASSIUM 50 MG PO TABS: 75.0000 mg | ORAL_TABLET | Freq: Every day | ORAL | 2 refills | Status: AC

## 2023-12-31 NOTE — Progress Notes (Signed)
 Careteam: Patient Care Team: Linda Almarie SAUNDERS, MD as PCP - General (Family Medicine)  PLACE OF SERVICE:  [facility]  Advanced Directive information    Allergies  Allergen Reactions   Penicillins Swelling and Rash    Has patient had a PCN reaction causing immediate rash, facial/tongue/throat swelling, SOB or lightheadedness with hypotension: Yes Has patient had a PCN reaction causing severe rash involving mucus membranes or skin necrosis: No Has patient had a PCN reaction that required hospitalization: No Has patient had a PCN reaction occurring within the last 10 years: No If all of the above answers are NO, then may proceed with Cephalosporin use.    Penicillin G Other (See Comments)   Codeine Nausea And Vomiting    Chief Complaint  Patient presents with   Establish Care    New patient      Discussed the use of AI scribe software for clinical note transcription with the patient, who gave verbal consent to proceed.  History of Present Illness  Linda Lawson is an 88 year old female with hypertension, diabetes, and memory issues who presents for a new patient visit and management of her chronic conditions. She is accompanied by her daughter, who lives six hours away.  She has a history of hypertension. She is taking losartan , which was prescribed after a visit to urgent care in December 2023.  Her diabetes was previously managed with insulin  but is now controlled with metformin .   Memory issues began in 2023, including difficulty remembering names and confusion that prevents her from going out alone. Her daughter notes that she cannot travel independently and has experienced episodes of hallucinations, seeing and hearing things that are not present. These issues have impacted her ability to attend medical appointments independently, and she requires assistance from her daughter, who visits several times a year.  She lives alone and manages daily activities such as  ordering groceries herself. However, she occasionally uses a cane for balance, which she describes as 'wobbly sometimes,' but denies any recent falls. She engages in minimal physical activity, walking only to the mailbox, and enjoys doing puzzles and word searches.  She experiences occasional knee pain and reports feeling tired sometimes. Her sleep is disrupted, with difficulty falling asleep and napping during the day. She has lost a few pounds recently but maintains a good appetite. No recent cough, congestion, or urinary incontinence.    Review of Systems:  Review of Systems  Constitutional:  Negative for chills and fever.  HENT:  Negative for congestion and sore throat.   Eyes:  Negative for double vision.  Respiratory:  Negative for cough, sputum production and shortness of breath.   Cardiovascular:  Negative for chest pain, palpitations and leg swelling.  Gastrointestinal:  Negative for abdominal pain, heartburn and nausea.  Genitourinary:  Negative for dysuria, frequency and hematuria.  Musculoskeletal:  Negative for falls and myalgias.  Neurological:  Negative for dizziness.  Psychiatric/Behavioral:  Positive for hallucinations and memory loss. The patient has insomnia.    Negative unless indicated in HPI.   Past Medical History:  Diagnosis Date   Anxiety    Arthritis    Cataract    Complication of anesthesia    Depression    situatinal   Diabetes mellitus without complication (HCC)    Type II   Elevated CK    Essential hypertension    Gout    Hip joint replacement status    History of aortic valvular stenosis - s/p AVR  2010   s/p AVR (tissue) 2010; normal valve function by echo in 2016   Hyperlipidemia    Pneumonia 2010   PONV (postoperative nausea and vomiting)    Premature atrial contractions - asymptomatic 08/24/2016   Primary osteoarthritis of left hip    Status post aortic valve replacement with bioprosthetic valve 08/24/2008   Past Surgical History:   Procedure Laterality Date   ABDOMINAL HYSTERECTOMY     AORTIC VALVE REPLACEMENT  2010   Bioprosthetic AVR ([facility])   CARDIAC VALVE REPLACEMENT     CARDIOLITE NUCLEAR STRESS TEST  10/28/2017    [facility]): EF 70%.  No ischemia or infarction.   COLONOSCOPY     HIP ARTHROPLASTY Right 2002   JOINT REPLACEMENT     KNEE ARTHROPLASTY Left 2012   LEFT HEART CATH AND CORONARY ANGIOGRAPHY  2010   Nonobstructive CAD as part of preop AVR surgery   TOTAL HIP ARTHROPLASTY Left 04/19/2016   Procedure: LEFT TOTAL HIP ARTHROPLASTY ANTERIOR APPROACH;  Surgeon: Kay CHRISTELLA Cummins, MD;  Location: [facility] OR;  Service: Orthopedics;  Laterality: Left;   TRANSTHORACIC ECHOCARDIOGRAM  12/04/2017   [facility]): Normal LV size and function with no segmental wall motion abnormality.  Mild concentric LVH.  EF 60 to 65%.  Normally functioning bioprosthetic aortic valve with only mild gradient (mean gradient 15 mmHg).  Mild MR.    Social History:   reports that she quit smoking about 15 years ago. Her smoking use included cigarettes. She started smoking about 35 years ago. She has a 3.8 pack-year smoking history. She has never used smokeless tobacco. She reports that she does not drink alcohol  and does not use drugs.  Family History  Problem Relation Age of Onset   Diabetes Maternal Grandmother    Heart disease Maternal Grandmother    Diabetes Sister     Medications: Patient's Medications  New Prescriptions   No medications on file  Previous Medications   ACCU-CHEK AVIVA PLUS TEST STRIP       ACCU-CHEK SOFTCLIX LANCETS LANCETS       ALCOHOL  SWABS (B-D SINGLE USE SWABS REGULAR) PADS       ALLOPURINOL  (ZYLOPRIM ) 100 MG TABLET    Take 100 mg by mouth daily.    ASPIRIN  EC 81 MG TABLET    Take 81 mg by mouth daily. Swallow whole.    BD ULTRA-FINE PEN NEEDLES 29G X 12.7MM MISC       DICLOFENAC SODIUM (VOLTAREN) 1 % GEL    As needed for leg pain   DOCUSATE SODIUM  (COLACE) 100 MG CAPSULE     Take 200 mg by mouth 2 (two) times daily as needed.   EVOLOCUMAB (REPATHA) 140 MG/ML SOSY    Inject 1 mL into the skin every 14 (fourteen) days.   LANTUS  SOLOSTAR 100 UNIT/ML SOLOSTAR PEN    Inject 10 Units into the skin daily.    LORATADINE  (CLARITIN ) 10 MG TABLET    Take 10 mg by mouth daily as needed for allergies or rhinitis.   LOSARTAN  (COZAAR ) 50 MG TABLET    Take 50 mg by mouth daily.    METFORMIN  (GLUCOPHAGE ) 500 MG TABLET    Take 500 mg by mouth daily.   MULTIPLE VITAMIN (MULTIVITAMIN PO)    Take by mouth daily.   OZEMPIC, 0.25 OR 0.5 MG/DOSE, 2 MG/1.5ML SOPN    Inject 0.25 mg into the skin once a week.   Modified Medications   No medications on file  Discontinued  Medications   No medications on file    Physical Exam: Vitals:   12/31/23 1307  BP: (!) 152/82  Pulse: 76  Temp: 97.8 F (36.6 C)  SpO2: 98%  Weight: 151 lb 9.6 oz (68.8 kg)  Height: 5' 4 (1.626 m)   Body mass index is 26.02 kg/m. BP Readings from Last 3 Encounters:  12/31/23 (!) 152/82  12/09/19 (!) 157/82  08/24/16 (!) 183/91   Wt Readings from Last 3 Encounters:  12/31/23 151 lb 9.6 oz (68.8 kg)  08/05/23 177 lb (80.3 kg)  12/09/19 177 lb (80.3 kg)    Physical Exam Constitutional:      Appearance: Normal appearance.  HENT:     Head: Normocephalic and atraumatic.  Cardiovascular:     Rate and Rhythm: Normal rate and regular rhythm.  Pulmonary:     Effort: Pulmonary effort is normal. No respiratory distress.     Breath sounds: Normal breath sounds. No wheezing.  Abdominal:     General: Bowel sounds are normal. There is no distension.     Tenderness: There is no abdominal tenderness. There is no guarding or rebound.     Comments:    Musculoskeletal:        General: No swelling.  Neurological:     Mental Status: She is alert. Mental status is at baseline.     Motor: No weakness.     Labs reviewed: Basic Metabolic Panel: No results for input(s): NA, K, CL, CO2, GLUCOSE, BUN,  CREATININE, CALCIUM , MG, PHOS, TSH in the last 8760 hours. Liver Function Tests: No results for input(s): AST, ALT, ALKPHOS, BILITOT, PROT, ALBUMIN in the last 8760 hours. No results for input(s): LIPASE, AMYLASE in the last 8760 hours. No results for input(s): AMMONIA in the last 8760 hours. CBC: No results for input(s): WBC, NEUTROABS, HGB, HCT, MCV, PLT in the last 8760 hours. Lipid Panel: No results for input(s): CHOL, HDL, LDLCALC, TRIG, CHOLHDL, LDLDIRECT in the last 8760 hours. TSH: No results for input(s): TSH in the last 8760 hours. A1C: Lab Results  Component Value Date   HGBA1C 7.9 (H) 04/17/2016    Assessment and Plan Assessment & Plan   1. Primary hypertension (Primary) Repeat bp high  Will increase losartan  to 75 mg  Avoid salty foods Check bp daily  2. Mixed hyperlipidemia Will check lipid panel  3. Type 2 diabetes mellitus without complication, without long-term current use of insulin  (HCC) Pt hasn't seen a provider since 2023 On metformin   Will check labs Will refer to opthalmology - Hemoglobin A1c - Microalbumin/Creatinine Ratio, Urine - Ambulatory referral to Ophthalmology - Lipid Panel  4. Memory deficits Since 2023 Family h/o dementia+ Hallucinations visual  Pt lives alone, independent with ADLS , needs help with all her IADLS  Daughter lives in [place]   Spoke with daughter to have an aid to help supervise patient  Will do [place] next   5. Vitamin D  deficiency  - Vitamin D , 25-hydroxy  6. B12 deficiency  - Vitamin B12  7. Other fatigue  - CBC (no diff) - Basic Metabolic Panel - TSH  8. Postmenopausal estrogen deficiency  - DG Bone Density; Future  Other orders - metFORMIN  (GLUCOPHAGE ) 500 MG tablet; Take 500 mg by mouth daily.

## 2024-01-01 ENCOUNTER — Other Ambulatory Visit: Payer: Self-pay | Admitting: Sports Medicine

## 2024-01-01 ENCOUNTER — Ambulatory Visit: Payer: Self-pay | Admitting: Sports Medicine

## 2024-01-01 DIAGNOSIS — D649 Anemia, unspecified: Secondary | ICD-10-CM

## 2024-01-01 MED ORDER — METFORMIN HCL 500 MG PO TABS: 500.0000 mg | ORAL_TABLET | Freq: Every day | ORAL | 1 refills | Status: AC

## 2024-01-01 MED ORDER — CYANOCOBALAMIN 500 MCG PO TABS
500.0000 ug | ORAL_TABLET | Freq: Every day | ORAL | 3 refills | Status: AC
Start: 2024-01-01 — End: ?

## 2024-01-01 MED ORDER — VITAMIN D3 25 MCG (1000 UT) PO CAPS
1000.0000 [IU] | ORAL_CAPSULE | Freq: Every day | ORAL | 3 refills | Status: AC
Start: 2024-01-01 — End: ?

## 2024-01-01 MED ORDER — ATORVASTATIN CALCIUM 10 MG PO TABS: 10.0000 mg | ORAL_TABLET | Freq: Every day | ORAL | 1 refills | Status: AC

## 2024-01-03 LAB — BASIC METABOLIC PANEL WITH GFR
BUN/Creatinine Ratio: 18 (calc) (ref 6–22)
BUN: 21 mg/dL (ref 7–25)
CO2: 25 mmol/L (ref 20–32)
Calcium: 9.2 mg/dL (ref 8.6–10.4)
Chloride: 106 mmol/L (ref 98–110)
Creat: 1.2 mg/dL — ABNORMAL HIGH (ref 0.60–0.95)
Glucose, Bld: 127 mg/dL (ref 65–139)
Potassium: 4.7 mmol/L (ref 3.5–5.3)
Sodium: 139 mmol/L (ref 135–146)
eGFR: 44 mL/min/1.73m2 — ABNORMAL LOW (ref >=60)

## 2024-01-03 LAB — TEST AUTHORIZATION

## 2024-01-03 LAB — CBC
HCT: 32 % — ABNORMAL LOW (ref 35.0–45.0)
Hemoglobin: 10.1 g/dL — ABNORMAL LOW (ref 11.7–15.5)
MCH: 24.3 pg — ABNORMAL LOW (ref 27.0–33.0)
MCHC: 31.6 g/dL — ABNORMAL LOW (ref 32.0–36.0)
MCV: 77.1 fL — ABNORMAL LOW (ref 80.0–100.0)
MPV: 11.3 fL (ref 7.5–12.5)
Platelets: 225 Thousand/uL (ref 140–400)
RBC: 4.15 Million/uL (ref 3.80–5.10)
RDW: 13 % (ref 11.0–15.0)
WBC: 6 Thousand/uL (ref 3.8–10.8)

## 2024-01-03 LAB — MICROALBUMIN / CREATININE URINE RATIO
Creatinine, Urine: 106 mg/dL (ref 20–275)
Microalb Creat Ratio: 737 mg/g{creat} — ABNORMAL HIGH (ref <30)
Microalb, Ur: 78.1 mg/dL

## 2024-01-03 LAB — HEMOGLOBIN A1C
Hgb A1c MFr Bld: 7 % — ABNORMAL HIGH (ref <5.7)
Mean Plasma Glucose: 154 mg/dL
eAG (mmol/L): 8.5 mmol/L

## 2024-01-03 LAB — TSH: TSH: 2.22 m[IU]/L (ref 0.40–4.50)

## 2024-01-03 LAB — LIPID PANEL
Cholesterol: 203 mg/dL — ABNORMAL HIGH (ref <200)
HDL: 76 mg/dL (ref >=50)
LDL Cholesterol (Calc): 109 mg/dL — ABNORMAL HIGH
Non-HDL Cholesterol (Calc): 127 mg/dL (ref <130)
Total CHOL/HDL Ratio: 2.7 (calc) (ref <5.0)
Triglycerides: 87 mg/dL (ref <150)

## 2024-01-03 LAB — VITAMIN B12: Vitamin B-12: 296 pg/mL (ref 200–1100)

## 2024-01-03 LAB — IRON,TIBC AND FERRITIN PANEL
%SAT: 19 % (ref 16–45)
Ferritin: 16 ng/mL (ref 16–288)
Iron: 64 ug/dL (ref 45–160)
TIBC: 343 ug/dL (ref 250–450)

## 2024-01-03 LAB — VITAMIN D 25 HYDROXY (VIT D DEFICIENCY, FRACTURES): Vit D, 25-Hydroxy: 24 ng/mL — ABNORMAL LOW (ref 30–100)

## 2024-01-16 ENCOUNTER — Encounter: Payer: Self-pay | Admitting: Sports Medicine

## 2024-01-17 NOTE — Telephone Encounter (Signed)
 Message routed to referral coordinator [email or web address]

## 2024-04-07 ENCOUNTER — Encounter: Payer: Self-pay | Admitting: Family

## 2024-04-07 ENCOUNTER — Ambulatory Visit: Payer: Self-pay | Admitting: Family

## 2024-04-07 VITALS — BP 138/78 | HR 60 | Temp 97.6°F | Resp 18 | Ht 64.0 in | Wt 143.6 lb

## 2024-04-07 DIAGNOSIS — H6123 Impacted cerumen, bilateral: Secondary | ICD-10-CM

## 2024-04-07 DIAGNOSIS — Z23 Encounter for immunization: Secondary | ICD-10-CM | POA: Diagnosis not present

## 2024-04-07 DIAGNOSIS — E782 Mixed hyperlipidemia: Secondary | ICD-10-CM | POA: Diagnosis not present

## 2024-04-07 DIAGNOSIS — B3789 Other sites of candidiasis: Secondary | ICD-10-CM | POA: Diagnosis not present

## 2024-04-07 DIAGNOSIS — R413 Other amnesia: Secondary | ICD-10-CM

## 2024-04-07 DIAGNOSIS — Z7984 Long term (current) use of oral hypoglycemic drugs: Secondary | ICD-10-CM

## 2024-04-07 DIAGNOSIS — E119 Type 2 diabetes mellitus without complications: Secondary | ICD-10-CM

## 2024-04-07 DIAGNOSIS — I10 Essential (primary) hypertension: Secondary | ICD-10-CM

## 2024-04-07 MED ORDER — NYSTATIN 100000 UNIT/GM EX CREA
1.0000 | TOPICAL_CREAM | Freq: Two times a day (BID) | CUTANEOUS | 0 refills | Status: AC
Start: 2024-04-07 — End: ?

## 2024-04-07 MED ORDER — DONEPEZIL HCL 5 MG PO TBDP: 5.0000 mg | ORAL_TABLET | Freq: Every day | ORAL | 1 refills | Status: AC

## 2024-04-07 NOTE — Patient Instructions (Signed)
 1.Report to local pharmacy to receive Covid,Shingles,& Tdap Vaccines. 2.Stop at check out & schedule Annual Wellness Visit.

## 2024-04-08 DIAGNOSIS — H5203 Hypermetropia, bilateral: Secondary | ICD-10-CM | POA: Diagnosis not present

## 2024-04-08 DIAGNOSIS — H52203 Unspecified astigmatism, bilateral: Secondary | ICD-10-CM | POA: Diagnosis not present

## 2024-04-08 DIAGNOSIS — E119 Type 2 diabetes mellitus without complications: Secondary | ICD-10-CM | POA: Diagnosis not present

## 2024-04-12 NOTE — Progress Notes (Signed)
 Provider: Roxan Plough FNP-C   No primary care provider on file.  No care team member to display  Extended Emergency Contact Information Primary Emergency Contact: Nygaard,Angela Address: [id number]          [place], [place] [address] [place] Home Phone: [phone] Relation: Daughter Secondary Emergency Contact: [facility] Address: [address]          [place], [place] [address] [place] Home Phone: [phone] Relation: Son  Code Status: Full code  Goals of care: Advanced Directive information    04/07/2024   12:55 PM  Advanced Directives  Does Patient Have a Medical Advance Directive? Yes  Type of Advance Directive Healthcare Power of Attorney  Does patient want to make changes to medical advance directive? No - Patient declined  Copy of Healthcare Power of Attorney in Chart? No - copy requested     Chief Complaint  Patient presents with   Medical Management of Chronic Issues    3 Month follow up.  Saw Discussed the use of AI scribe software for clinical note transcription with the patient, who gave verbal consent to proceed.  History of Present Illness   Linda Lawson is an 88 year old female with dementia who presents for a three-month follow-up and memory testing.  She has been experiencing worsening memory issues, including forgetting to take medications and eat properly, sometimes eating multiple times due to forgetfulness. Hallucinations and anxiety have been increasing in frequency, particularly at night, contributing to her anxiety. She does not currently take medication for these symptoms.  Her medical history includes diabetes, hypertension, and hyperlipidemia. She is currently taking atorvastatin  10 mg for cholesterol, losartan  75 mg for blood pressure, metformin  500 mg daily for diabetes, vitamin B12 500 mcg, and a multivitamin. She is no longer taking allopurinol , Ropatha, Lantus , Claritin , or aspirin .  Her daughter notes that she sometimes forgets to take her medications, which exacerbates her symptoms.  Her blood work from August showed an A1c of 7. She reports eating about three meals a day, although her daughter notes she sometimes forgets to eat or eats multiple times. She has lost weight since her last visit in August, dropping from 151 pounds to 143 pounds.  She experiences constipation about twice a month and drinks water twice a day, which her daughter suggests may not be sufficient. She urinates once or twice a day, which may indicate inadequate fluid intake. She sometimes experiences swelling in her ankles.  She sleeps inconsistently, often due to anxiety or depression. Her daughter notes that she experiences hallucinations, particularly at night, which contribute to her anxiety.  She uses a cane for mobility and occasionally a walker, although she finds the latter embarrassing. She lives at home and her daughter is exploring options for additional in-home care to assist with medication management and daily activities.   Past Medical History:  Diagnosis Date   Anxiety    Arthritis    Cataract    Complication of anesthesia    Depression    situatinal   Diabetes mellitus without complication (HCC)    Type II   Elevated CK    Essential hypertension    Gout    Hip joint replacement status    History of aortic valvular stenosis - s/p AVR 2010   s/p AVR (tissue) 2010; normal valve function by echo in 2016   Hyperlipidemia    Pneumonia 2010   PONV (postoperative nausea and vomiting)  Premature atrial contractions - asymptomatic 08/24/2016   Primary osteoarthritis of left hip    Status post aortic valve replacement with bioprosthetic valve 08/24/2008   Past Surgical History:  Procedure Laterality Date   ABDOMINAL HYSTERECTOMY     AORTIC VALVE REPLACEMENT  2010   Bioprosthetic AVR ([facility])   CARDIAC VALVE REPLACEMENT     CARDIOLITE NUCLEAR STRESS TEST   10/28/2017    [facility]): EF 70%.  No ischemia or infarction.   COLONOSCOPY     HIP ARTHROPLASTY Right 2002   JOINT REPLACEMENT     KNEE ARTHROPLASTY Left 2012   LEFT HEART CATH AND CORONARY ANGIOGRAPHY  2010   Nonobstructive CAD as part of preop AVR surgery   TOTAL HIP ARTHROPLASTY Left 04/19/2016   Procedure: LEFT TOTAL HIP ARTHROPLASTY ANTERIOR APPROACH;  Surgeon: Kay CHRISTELLA Cummins, MD;  Location: [facility] OR;  Service: Orthopedics;  Laterality: Left;   TRANSTHORACIC ECHOCARDIOGRAM  12/04/2017   [facility]): Normal LV size and function with no segmental wall motion abnormality.  Mild concentric LVH.  EF 60 to 65%.  Normally functioning bioprosthetic aortic valve with only mild gradient (mean gradient 15 mmHg).  Mild MR.     Allergies  Allergen Reactions   Penicillins Swelling and Rash    Has patient had a PCN reaction causing immediate rash, facial/tongue/throat swelling, SOB or lightheadedness with hypotension: Yes Has patient had a PCN reaction causing severe rash involving mucus membranes or skin necrosis: No Has patient had a PCN reaction that required hospitalization: No Has patient had a PCN reaction occurring within the last 10 years: No If all of the above answers are NO, then may proceed with Cephalosporin use.    Penicillin G Other (See Comments)   Codeine Nausea And Vomiting    Allergies as of 04/07/2024       Reactions   Penicillins Swelling, Rash   Has patient had a PCN reaction causing immediate rash, facial/tongue/throat swelling, SOB or lightheadedness with hypotension: Yes Has patient had a PCN reaction causing severe rash involving mucus membranes or skin necrosis: No Has patient had a PCN reaction that required hospitalization: No Has patient had a PCN reaction occurring within the last 10 years: No If all of the above answers are NO, then may proceed with Cephalosporin use.   Penicillin G Other (See Comments)   Codeine Nausea And Vomiting         Medication List        Accurate as of April 07, 2024 11:59 PM. If you have any questions, ask your nurse or doctor.          STOP taking these medications    Accu-Chek Aviva Plus test strip Generic drug: glucose blood Stopped by: Briannie Gutierrez C Avry Roedl   Accu-Chek Softclix Lancets lancets Stopped by: Roxan BROCKS Mclane Arora   allopurinol  100 MG tablet Commonly known as: ZYLOPRIM  Stopped by: Terreon Ekholm C Amey Hossain   aspirin  EC 81 MG tablet Stopped by: Lashawn Orrego C Reya Aurich   B-D SINGLE USE SWABS REGULAR Pads Stopped by: Nessie Nong C Kensley Valladares   BD ULTRA-FINE PEN NEEDLES 29G X 12.7MM Misc Generic drug: Insulin  Pen Needle Stopped by: Roxan BROCKS Sherryn Pollino   diclofenac sodium 1 % Gel Commonly known as: VOLTAREN Stopped by: Mubashir Mallek C Pamelyn Bancroft   docusate sodium  100 MG capsule Commonly known as: COLACE Stopped by: Hadlea Furuya C Charity Tessier   Lantus  SoloStar 100 UNIT/ML Solostar Pen Generic drug: insulin  glargine Stopped by: Denice Cardon C Delrick Dehart   loratadine  10 MG tablet  Commonly known as: CLARITIN  Stopped by: Lawsyn Heiler C Ysabel Stankovich   Repatha 140 MG/ML Sosy Generic drug: Evolocumab Stopped by: Cristal Howatt C Zahara Rembert       TAKE these medications    atorvastatin  10 MG tablet Commonly known as: LIPITOR Take 1 tablet (10 mg total) by mouth daily.   cyanocobalamin  500 MCG tablet Commonly known as: VITAMIN B12 Take 1 tablet (500 mcg total) by mouth daily.   donepezil  5 MG disintegrating tablet Commonly known as: ARICEPT  ODT Take 1 tablet (5 mg total) by mouth at bedtime. Started by: Amel Gianino C Billiejean Schimek   losartan  50 MG tablet Commonly known as: COZAAR  Take 1.5 tablets (75 mg total) by mouth daily.   metFORMIN  500 MG tablet Commonly known as: GLUCOPHAGE  Take 1 tablet (500 mg total) by mouth daily.   MULTIVITAMIN PO Take by mouth daily.   nystatin  cream Commonly known as: MYCOSTATIN  Apply 1 Application topically 2 (two) times daily. Started by: Aina Rossbach C Kienan Doublin   Vitamin D3 25 MCG (1000 UT) Caps Take 1 capsule (1,000  Units total) by mouth daily.        Review of Systems  Constitutional:  Negative for appetite change, chills, fatigue, fever and unexpected weight change.  HENT:  Negative for congestion, dental problem, ear discharge, ear pain, hearing loss, nosebleeds, postnasal drip, rhinorrhea, sinus pressure, sinus pain, sneezing, sore throat, tinnitus and trouble swallowing.   Eyes:  Negative for pain, discharge, redness, itching and visual disturbance.  Respiratory:  Negative for cough, chest tightness, shortness of breath and wheezing.   Cardiovascular:  Negative for chest pain, palpitations and leg swelling.  Gastrointestinal:  Negative for abdominal distention, abdominal pain, blood in stool, constipation, diarrhea, nausea and vomiting.  Endocrine: Negative for cold intolerance, heat intolerance, polydipsia, polyphagia and polyuria.  Genitourinary:  Negative for difficulty urinating, dysuria, flank pain, frequency and urgency.  Musculoskeletal:  Positive for gait problem. Negative for arthralgias, back pain, joint swelling, myalgias, neck pain and neck stiffness.  Skin:  Negative for color change, pallor, rash and wound.  Neurological:  Negative for dizziness, syncope, speech difficulty, weakness, light-headedness, numbness and headaches.  Hematological:  Does not bruise/bleed easily.  Psychiatric/Behavioral:  Positive for behavioral problems and hallucinations. Negative for agitation, confusion, self-injury, sleep disturbance and suicidal ideas. The patient is nervous/anxious.        Memory loss    Immunization History  Administered Date(s) Administered   INFLUENZA, HIGH DOSE SEASONAL PF 01/30/2017, 04/07/2024   Influenza, Mdck, Trivalent,PF 6+ MOS(egg free) 01/05/2015   Influenza-Unspecified 02/11/2013, 02/09/2021   PFIZER(Purple Top)SARS-COV-2 Vaccination 07/05/2019, 07/29/2019   PNEUMOCOCCAL CONJUGATE-20 04/07/2024   PPD Test 04/25/2016   Pneumococcal Conjugate-13 01/30/2017    Unspecified SARS-COV-2 Vaccination 02/09/2021   Pertinent  Health Maintenance Due  Topic Date Due   OPHTHALMOLOGY EXAM  08/25/2021   HEMOGLOBIN A1C  07/02/2024   FOOT EXAM  08/04/2024   Influenza Vaccine  Completed   Bone Density Scan  Completed      12/31/2023    1:06 PM 04/07/2024   12:55 PM  Fall Risk  Falls in the past year? 0 1  Was there an injury with Fall? 0 0  Fall Risk Category Calculator 0 1  Patient at Risk for Falls Due to No Fall Risks No Fall Risks  Fall risk Follow up Falls evaluation completed Falls evaluation completed   Functional Status Survey:    Vitals:   04/07/24 1258  BP: 138/78  Pulse: 60  Resp: 18  Temp: 97.6 F (36.4  C)  SpO2: 98%  Weight: 143 lb 9.6 oz (65.1 kg)  Height: 5' 4 (1.626 m)   Body mass index is 24.65 kg/m. Physical Exam of the VITALS: T- 97.6, P- 60, BP- 138/78, SaO2- 98% MEASUREMENTS: Weight- 143. GENERAL: Alert, cooperative, well developed, no acute distress. HEENT: Normocephalic, normal oropharynx, moist mucous membranes. Cerumen impaction bilaterally, tympanic membranes not visible. Nose normal. No sinus tenderness. NECK: No lymphadenopathy. Thyroid  normal. CHEST: Clear to auscultation bilaterally, no wheezes, rhonchi, or crackles. CARDIOVASCULAR: Normal heart rate and rhythm, S1 and S2 normal without murmurs. BREAST: Redness under right breast, possible yeast infection. ABDOMEN: Soft, non-tender, non-distended, without organomegaly. Normal bowel sounds. EXTREMITIES: No cyanosis or edema. Left foot slightly more swollen than right. No wounds on extremities. MUSCULOSKELETAL: Knees non-tender, normal range of motion. Shoulders non-tender, normal range of motion. NEUROLOGICAL: Cranial nerves grossly intact, moves all extremities without gross motor or sensory deficit. SKIN: Skin on legs very dry.  PSYCHIATRY/BEHAVIORAL: Mood anxious   Labs reviewed: Recent Labs    12/31/23 1341  NA 139  K 4.7  CL 106  CO2 25   GLUCOSE 127  BUN 21  CREATININE 1.20*  CALCIUM  9.2   No results for input(s): AST, ALT, ALKPHOS, BILITOT, PROT, ALBUMIN in the last 8760 hours. Recent Labs    12/31/23 1341  WBC 6.0  HGB 10.1*  HCT 32.0*  MCV 77.1*  PLT 225   Lab Results  Component Value Date   TSH 2.22 12/31/2023   Lab Results  Component Value Date   HGBA1C 7.0 (H) 12/31/2023   Lab Results  Component Value Date   CHOL 203 (H) 12/31/2023   HDL 76 12/31/2023   LDLCALC 109 (H) 12/31/2023   TRIG 87 12/31/2023   CHOLHDL 2.7 12/31/2023    Significant Diagnostic Results in last 30 days:  No results found.  Assessment/Plan  Dementia with behavioral disturbance Dementia with memory issues and behavioral disturbances, including hallucinations and anxiety. Memory test (6CIT) showed significant deficits. Discussed potential use of Aricept  and Namenda, but concerns about heart rate lowering with Aricept . Behavioral disturbances include hallucinations and anxiety, possibly related to dementia progression. - Prescribed Aricept  5 mg daily, will monitor heart rate - Will consider Namenda for behavioral disturbances - Referred for caregiver assistance to manage daily activities and medication adherence - Discussed potential for adult day programs for social interaction and support  Type 2 diabetes mellitus Well-controlled with an A1c of 7.0% as of August. No hypoglycemic episodes reported. Discussed importance of regular meals and avoiding juice to prevent blood sugar spikes. - Continue metformin  500 mg daily - Encouraged regular meals and avoidance of juice - Monitor blood glucose levels at home  Essential hypertension Blood pressure is well-controlled at 138/78 mmHg. Discussed importance of medication adherence and lifestyle modifications. - Continue losartan  75 mg daily - Encouraged regular blood pressure monitoring  Mixed hyperlipidemia Managed with atorvastatin  10 mg daily. - Continue  atorvastatin  10 mg daily  Candidiasis of skin, right breast Yeast infection under the right breast, likely due to moisture retention from wearing a bra. No recent itching reported. - Prescribed nystatin  cream, apply twice daily for one week - Encouraged wearing a bra during the day to reduce moisture  Impacted cerumen, bilateral Bilateral impacted cerumen obstructing view of eardrums. Discussed use of over-the-counter drops to soften wax and potential referral to ENT for removal if needed. - Use Duprox drops, 5 drops in each ear twice daily for 4 days - Will refer to ENT for  cerumen removal if drops are ineffective  Depression and anxiety symptoms Reports of depression and anxiety, with anxiety exacerbated by hallucinations and confusion. No current medication for these symptoms. - Discussed potential benefits of caregiver support for emotional support  General Health Maintenance Discussed importance of hydration, regular meals, and skin care to prevent infections. Encouraged use of a water bottle to increase fluid intake and regular showers to maintain skin hygiene. - Encouraged increased water intake throughout the day - Advised regular showers and thorough drying of skin - Recommended use of moisturizer for dry skin   Family/ staff Communication: Reviewed plan of care with patient and daughter verbalized understanding  Labs/tests ordered:  - CBC with Differential/Platelet - CMP with eGFR([facility]) - TSH - Hgb A1C - Lipid panel  Next Appointment : Return in about 4 months (around 08/05/2024) for medical mangement of chronic issues., Fasting labs in 6 months prior to visit.   Spent 40 minutes of Face to face and non-face to face with patient  >50% time spent counseling; reviewing medical record; tests; labs; documentation and developing future plan of care.   Roxan JAYSON Plough, NP

## 2024-04-16 ENCOUNTER — Encounter (INDEPENDENT_AMBULATORY_CARE_PROVIDER_SITE_OTHER): Payer: Self-pay

## 2024-04-24 ENCOUNTER — Encounter: Payer: Self-pay | Admitting: Pharmacist

## 2024-04-24 NOTE — Progress Notes (Signed)
 Pharmacy Quality Measure Review  This patient is appearing on a report for being at risk of failing the adherence measure for cholesterol (statin) and hypertension (ACEi/ARB) medications this calendar year.  Medication: atorvastatin  Last fill date: 08/202/2025 for 90 day supply per adherence report.    Medication: losartan  Last fill date: 12/31/2023 for 90 day supply per adherence report.   Reviewed recent refill history in Dr Annemarie database. Actual last refill date was 04/08/2024 for 90 day supply for both atorvastatin  and losartan . Patient has no refills remaining on atorvastatin  and 1 refill remaining on losartan . She is transitioning from Dr Sherlynn as her PCP to Roxan Plough, NP. Next appointment with PCP is not yet set. Will forward message to clinical pharmacist that covers [facility] office to help facilitate refills for atorvastatin  and follow up with PCP.    Insurance report was not up to date. No action needed at this time.   Madelin Ray, PharmD Clinical Pharmacist [facility]  Population Health [phone]

## 2024-04-27 ENCOUNTER — Telehealth: Payer: Self-pay | Admitting: Pharmacist

## 2024-04-27 DIAGNOSIS — E785 Hyperlipidemia, unspecified: Secondary | ICD-10-CM

## 2024-04-27 NOTE — Progress Notes (Unsigned)
 Patient ID: Linda Lawson, female   DOB: Dec 16, 1935, 88 y.o.   MRN: [id number]  Pharmacy Quality Measure Review  This patient is appearing on a report for being at risk of failing the adherence measure for cholesterol (statin) medications this calendar year.   Medication: Atorvastatin  10 mg Last fill date: 04/08/2024 for 90 day supply  Insurance report was not up to date. No action needed at this time.   Will need refills prior to the next refill--will check back on refills.   Lipid Panel     Component Value Date/Time   CHOL 203 (H) 12/31/2023 1341   TRIG 87 12/31/2023 1341   HDL 76 12/31/2023 1341   CHOLHDL 2.7 12/31/2023 1341   LDLCALC 109 (H) 12/31/2023 1341     Cassius DOROTHA Brought, PharmD, BCACP Clinical Pharmacist [phone]
